# Patient Record
Sex: Female | Born: 1975 | Race: White | Hispanic: No | Marital: Single | Smoking: Never smoker
Health system: Southern US, Community
[De-identification: ages and names within clinical notes are randomized; demographics above are authoritative.]

## PROBLEM LIST (undated history)

## (undated) DIAGNOSIS — E669 Obesity, unspecified: Secondary | ICD-10-CM

## (undated) DIAGNOSIS — I10 Essential (primary) hypertension: Secondary | ICD-10-CM

## (undated) DIAGNOSIS — K529 Noninfective gastroenteritis and colitis, unspecified: Secondary | ICD-10-CM

## (undated) DIAGNOSIS — F79 Unspecified intellectual disabilities: Secondary | ICD-10-CM

## (undated) DIAGNOSIS — E119 Type 2 diabetes mellitus without complications: Secondary | ICD-10-CM

## (undated) HISTORY — PX: ABDOMINAL SURGERY: SHX537

---

## 2000-09-05 ENCOUNTER — Encounter: Admission: RE | Admit: 2000-09-05 | Discharge: 2000-09-05 | Payer: Self-pay | Admitting: *Deleted

## 2000-11-02 ENCOUNTER — Emergency Department (HOSPITAL_COMMUNITY): Admission: EM | Admit: 2000-11-02 | Discharge: 2000-11-02 | Payer: Self-pay | Admitting: Emergency Medicine

## 2000-11-02 ENCOUNTER — Encounter: Payer: Self-pay | Admitting: Emergency Medicine

## 2005-01-14 ENCOUNTER — Inpatient Hospital Stay (HOSPITAL_COMMUNITY): Admission: EM | Admit: 2005-01-14 | Discharge: 2005-01-19 | Payer: Self-pay | Admitting: Emergency Medicine

## 2005-01-16 ENCOUNTER — Ambulatory Visit: Payer: Self-pay | Admitting: Cardiovascular Disease

## 2005-07-07 ENCOUNTER — Encounter: Admission: RE | Admit: 2005-07-07 | Discharge: 2005-07-07 | Payer: Self-pay | Admitting: Family Medicine

## 2008-01-18 ENCOUNTER — Emergency Department (HOSPITAL_COMMUNITY): Admission: EM | Admit: 2008-01-18 | Discharge: 2008-01-18 | Payer: Self-pay | Admitting: Emergency Medicine

## 2010-09-04 NOTE — H&P (Signed)
NAMECHELCEY, CAPUTO          ACCOUNT NO.:  [id number]   MEDICAL RECORD NO.:  [id number]          PATIENT TYPE:  INP   LOCATION:  6529                         FACILITY:  [facility]   PHYSICIAN:  Lonia Blood, M.D.      DATE OF BIRTH:  20-Jul-1975   DATE OF ADMISSION:  01/14/2005  DATE OF DISCHARGE:                                HISTORY & PHYSICAL   PRIMARY CARE PHYSICIAN:  Dr. [name] so she is unassigned.   PRESENTING COMPLAINT:  Chest pain, near syncope.   HISTORY OF PRESENT ILLNESS:  Patient is a 35 year old with history of mental  retardation who lives with a caregiver at her house.  She has baseline  history of hypertension only.  Came in today secondary to sudden onset of  retrosternal chest pain described as heaviness associated with some  diaphoresis and near syncopal episode.  Patient denied any vomiting, but was  mildly nauseated.  This is the first episode.  Patient has risk factors for  cardiac disease including hypertension, dyslipidemia, obesity, and she has  secondhand smoke.  Her caregiver's husband smokes a lot.  No family history  but patient and caregiver is not aware of where her father is and what his  baseline medical history may be.   PAST MEDICAL HISTORY:  1.  Mental retardation.  2.  Hypertension.  3.  Joint pain as well and probably arthritis.   MEDICATIONS:  1.  Hydrochlorothiazide 25 mg daily.  2.  Nutritional supplements.   ALLERGIES:  Patient has no known drug allergies but she is allergic to bee  stings.   SOCIAL HISTORY:  As indicated, patient is under complete care of her current  caregiver.  She is mentally retarded and adopted.  No tobacco or alcohol  use.   FAMILY HISTORY:  Patient and caregiver do not know where her father is or  his background.  Her mom is deceased but she had baseline history of  diabetes.   REVIEW OF SYSTEMS:  10-point review of systems essentially as in HPI.   PHYSICAL EXAMINATION:  VITAL SIGNS:   Temperature 97.7, blood pressure  113/91, pulse 78, respiratory rate 22, saturations 96% on room air.  GENERAL:  She is alert and oriented, in no acute distress.  HEENT:  PERRL.  Patient is pleasantly retarded, mainly talking to her  caregiver.  HEENT shows poor dentition otherwise.  She had moon facies.  NECK:  Supple.  No JVD.  No lymphadenopathy.  RESPIRATORY:  Good air entry bilaterally.  No wheezes or rales.  CARDIOVASCULAR:  She has regular rate and rhythm.  ABDOMEN:  Soft, obese, nontender with positive bowel sounds.  EXTREMITIES:  No edema, cyanosis, or clubbing.   LABORATORIES:  White count 11.2, hemoglobin 15.3, platelet count 273 with  normal differentials.  Sodium 138, potassium 3.2, chloride 103, BUN 12,  glucose 89, bicarbonate of 30, creatinine 0.8.  Initial cardiac enzymes in  the ED are negative.  LFTs are also negative.  Her lipase is 71, amylase is  83.  D-dimer 1.32.  EKG showed sinus tachycardia on arrival with a rate of  105, normal  intervals.  Patient had flipped Ts in the lateral leads which is  a change from her previous EKG from July 2002.  Chest x-ray showed no acute  disease.  Chest CT without contrast was negative for PE.   ASSESSMENT:  This is a 35 year old presenting with some chest pain,  diaphoresis, and what appeared to be near syncope.  Patient is currently  doing okay with no chest pain on nitroglycerin except for headache.  Patient  also has mildly elevated lipase, but normal amylase.  Based on her risk  factor profile and the mild EKG change, although subtle, patient is likely  going to require cardiac rule out and work-up.  That will be followed up by  some risk stratification as well, maybe Cardiolite if her enzymes are  negative.  Will therefore do the following.   PLAN:  1.  Admit her to tele bed for 23-hour observation.  2.  Treat her hypertension with her home medication hydrochlorothiazide.  I      will repeat the lipase in the morning to  see if this is a fluke.  She is      not tender in the abdomen indicating that this is not likely to be      pancreatitis.  Correct her potassium.  Keep her on some nitroglycerin,      aspirin.  Consider beta blockers if her blood pressure will allow.  I      will put her on some Lovenox, but mainly prophylactic dose at this      stage.      Lonia Blood, M.D.  Electronically Signed     LG/MEDQ  D:  01/14/2005  T:  01/15/2005  Job:  [id number]

## 2010-09-04 NOTE — Discharge Summary (Signed)
Susan Garrison, Susan Garrison          ACCOUNT NO.:  [id number]   MEDICAL RECORD NO.:  [id number]          PATIENT TYPE:  INP   LOCATION:  3733                         FACILITY:  [facility]   PHYSICIAN:  Mobolaji B. Bakare, M.D.DATE OF BIRTH:  Nov 24, 1975   DATE OF ADMISSION:  01/14/2005  DATE OF DISCHARGE:  01/19/2005                                 DISCHARGE SUMMARY   FINAL DIAGNOSES:  1.  Noncardiac chest pain.  2.  Vertigo.  3.  Hypertension, controlled.  4.  Hyperlipidemia.  5.  Hyperglycemia.  6.  Mental retardation.  7.  Hypokalemia.   OPERATION/PROCEDURE:  1.  Cardiolite stress test with no stress induced ischemia.  Fixed defect      involved in the vascular inferior wall at the anterolateral apex septal      hypokinesis nonspecific. Ejection fraction of 51%.  2.  MRI of the lumbar spine showed no disk protrusion.  Spinal foraminal      stenosis in the lumbar spine.   Please refer to the admission history and physical.   HOSPITAL COURSE:  1.  Chest pain.  [name] is a 35 year old Caucasian female. She is      mentally retarded.  She presented with chest pain and diaphoresis and      question of syncope.  She was admitted to rule out cardiac ischemia.      She underwent a Cardiolite stress test which was significant for any      ischemia.  Because of oxygenization, there was no chest pain.  Telemetry      did not reveal any abnormal arrhythmia.  She had a positive D-dimer and      was subsequently evaluated with a CT scan angiogram which did not show      any pulmonary embolus.  She is felt to have metabolic syndrome with      obesity, elevated triglyceride, and low HDL.  She was consulted on      lifestyle changes.  2.  Orthostatic hypotension.  This was felt to be responsible for the      presyncope/syncope.  In addition, she also described symptoms      significant of vertigo.  She was empirically treated with meclizine with      resolution of symptoms.  3.   Hypertension.  This was controlled with adjustment of medication and      hospitalization.  Norvasc was increased.  4.  Hyperglycemia.  The patient was noted to have mildly elevated glucose.      She was transferred on dietary changes and lifestyle changes.  This was      done in particular so primary care giver. She was also started on niacin      in view of the dyslipidemia.   PERTINENT LABORATORY DATA:  Total cholesterol 194, HDL 37, LDL 96,  triglycerides 306,  Hemoglobin A1C 6.5. TSH 2.567.  D-dimer 1.32.  CT  angiogram of the chest was normal.  No evidence of pulmonary embolism or  acute findings.  CT scan of the head was negative for any acute intracranial  findings.  Chest x-ray:  Low  volume examination, acute chest disease.   CONSULTATIONS:  Cardiology consultation with [facility].   DISCHARGE MEDICATIONS:  1.  Hydrochlorothiazide 25 mg one daily.  2.  Norvasc 5 mg one p.o. daily.  3.  Niacin 500 mg once a day.   FOLLOW UP:  With Dr. Vickey Sages in one to two weeks.  She should have a followup  as to the lipid profile and first and third sugar in three months.      Mobolaji B. Corky Downs, M.D.  Electronically Signed     MBB/MEDQ  D:  02/07/2005  T:  02/08/2005  Job:  [id number]

## 2011-01-18 LAB — URINALYSIS, ROUTINE W REFLEX MICROSCOPIC
Bilirubin Urine: NEGATIVE
Glucose, UA: NEGATIVE
Ketones, ur: NEGATIVE
Leukocytes, UA: NEGATIVE
Nitrite: NEGATIVE
Protein, ur: 100 — AB
Specific Gravity, Urine: 1.024
Urobilinogen, UA: 0.2
pH: 5.5

## 2011-01-18 LAB — POCT I-STAT, CHEM 8
Chloride: 106
Glucose, Bld: 110 — ABNORMAL HIGH
Hemoglobin: 14.6
Sodium: 143
TCO2: 27

## 2011-01-18 LAB — DIFFERENTIAL
Basophils Absolute: 0.3 — ABNORMAL HIGH
Basophils Relative: 2 — ABNORMAL HIGH
Eosinophils Absolute: 0.2
Eosinophils Relative: 2
Lymphocytes Relative: 19
Lymphs Abs: 2.4
Monocytes Absolute: 0.9
Monocytes Relative: 7

## 2011-01-18 LAB — COMPREHENSIVE METABOLIC PANEL
ALT: 13
AST: 18
Albumin: 3.5
BUN: 10
Calcium: 9.2
Chloride: 105
GFR calc non Af Amer: >60

## 2011-01-18 LAB — URINE MICROSCOPIC-ADD ON

## 2011-01-18 LAB — POCT PREGNANCY, URINE: Preg Test, Ur: NEGATIVE

## 2011-01-18 LAB — CBC
MCV: 87.9
Platelets: 373

## 2011-06-27 ENCOUNTER — Other Ambulatory Visit: Payer: Self-pay

## 2011-06-27 ENCOUNTER — Emergency Department (HOSPITAL_COMMUNITY): Payer: Medicare Other

## 2011-06-27 ENCOUNTER — Emergency Department (HOSPITAL_COMMUNITY)
Admission: EM | Admit: 2011-06-27 | Discharge: 2011-06-27 | Disposition: A | Discharge status: Home Health | Payer: Medicare Other | Attending: Emergency Medicine | Admitting: Emergency Medicine

## 2011-06-27 ENCOUNTER — Encounter (HOSPITAL_COMMUNITY): Payer: Self-pay | Admitting: *Deleted

## 2011-06-27 DIAGNOSIS — R079 Chest pain, unspecified: Secondary | ICD-10-CM | POA: Insufficient documentation

## 2011-06-27 DIAGNOSIS — R42 Dizziness and giddiness: Secondary | ICD-10-CM | POA: Insufficient documentation

## 2011-06-27 DIAGNOSIS — K219 Gastro-esophageal reflux disease without esophagitis: Secondary | ICD-10-CM | POA: Insufficient documentation

## 2011-06-27 DIAGNOSIS — I10 Essential (primary) hypertension: Secondary | ICD-10-CM | POA: Insufficient documentation

## 2011-06-27 DIAGNOSIS — F70 Mild intellectual disabilities: Secondary | ICD-10-CM | POA: Insufficient documentation

## 2011-06-27 HISTORY — DX: Unspecified intellectual disabilities: F79

## 2011-06-27 HISTORY — DX: Essential (primary) hypertension: I10

## 2011-06-27 HISTORY — DX: Obesity, unspecified: E66.9

## 2011-06-27 LAB — CBC
HCT: 40.6 % (ref 36.0–46.0)
Hemoglobin: 13.8 g/dL (ref 12.0–15.0)
MCHC: 34 g/dL (ref 30.0–36.0)
RBC: 4.67 MIL/uL (ref 3.87–5.11)
RDW: 13.6 % (ref 11.5–15.5)

## 2011-06-27 LAB — DIFFERENTIAL
Basophils Relative: 0 % (ref 0–1)
Lymphocytes Relative: 19 % (ref 12–46)
Lymphs Abs: 1.7 10*3/uL (ref 0.7–4.0)
Monocytes Absolute: 0.6 10*3/uL (ref 0.1–1.0)
Monocytes Relative: 7 % (ref 3–12)
Neutrophils Relative %: 72 % (ref 43–77)

## 2011-06-27 LAB — COMPREHENSIVE METABOLIC PANEL
Calcium: 8.8 mg/dL (ref 8.4–10.5)
GFR calc Af Amer: >90 mL/min (ref >90)
Sodium: 139 mEq/L (ref 135–145)

## 2011-06-27 MED ORDER — GI COCKTAIL ~~LOC~~
30.0000 mL | Freq: Once | ORAL | Status: AC
  Administered 2011-06-27: 30 mL via ORAL
  Filled 2011-06-27: qty 30

## 2011-06-27 MED ORDER — PANTOPRAZOLE SODIUM 20 MG PO TBEC: 40.0000 mg | DELAYED_RELEASE_TABLET | Freq: Every day | ORAL | Status: AC

## 2011-06-27 NOTE — Discharge Instructions (Signed)
Your testing today was normal. This may represent acid reflux. Please take the medication as prescribed to see if this helps with the pain. Return to the ED for worsening pain, shortness of breath, or other worrisome symptoms.  RESOURCE GUIDE  Dental Problems  Patients with Medicaid: [facility] [phone]                                           [phone] [address] Phone:  [phone]                                                  Phone:  [phone]  If unable to pay or uninsured, contact:  [facility] or [facility]. to become qualified for the adult dental clinic.  Chronic Pain Problems Contact [facility]  [phone] Patients need to be referred by their primary care doctor.  Insufficient Money for Medicine Contact United Way:  call "211" or [facility] 409-069-3322.  No Primary Care Doctor Call [facility]  [phone] Other agencies that provide inexpensive medical care    [facility]  [phone]    [facility]  [phone]    [facility]  [phone]    [facility]  [phone]    [facility]  [phone]    [facility]  [phone]  Psychological Services [facility]  [phone] [facility]  [phone] [facility]   [phone] (emergency services 7062005707)  Substance Abuse Resources [facility]  (541)077-6513 [facility] ([phone] [facility] [place] 701-643-6073 Floydene Flock (941)510-4848 Residential & Outpatient Substance Abuse Program  [phone]  Abuse/Neglect [facility] Child Abuse Hotline (559)449-9264 [facility] Child Abuse Hotline 831-620-9710 (After Hours)  Emergency Shelter [facility] (571)760-7200  Maternity Homes [facility] [phone] [facility] [phone]  MRSA  Hotline #:   (805)437-0810    [facility] Resources  [facility]     [employer]                          [facility] Dept. [address] [place]                       [address]      [address]  [place]                                                Cristobal Goldmann Phone:  [phone]  Phone:  [phone]                 Phone:  [phone]  [facility] Phone:  [phone]  [facility] Child Abuse Hotline [phone] (After Hours)  Gastroesophageal Reflux Disease, Adult Gastroesophageal reflux disease (GERD) happens when acid from your stomach flows up into the esophagus. When acid comes in contact with the esophagus, the acid causes soreness (inflammation) in the esophagus. Over time, GERD may create small holes (ulcers) in the lining of the esophagus. CAUSES   Increased body weight. This puts pressure on the stomach, making acid rise from the stomach into the esophagus.   Smoking. This increases acid production in the stomach.   Drinking alcohol. This causes decreased pressure in the lower esophageal sphincter (valve or ring of muscle between the esophagus and stomach), allowing acid from the stomach into the esophagus.   Late evening meals and a full stomach. This increases pressure and acid production in the stomach.   A malformed lower esophageal sphincter.  Sometimes, no cause is found. SYMPTOMS   Burning pain in the lower part of the mid-chest behind the breastbone and in the mid-stomach area. This may occur twice a week or more often.   Trouble swallowing.   Sore throat.   Dry cough.   Asthma-like symptoms including chest tightness, shortness of breath, or wheezing.  DIAGNOSIS  Your caregiver may be able to diagnose GERD based on your symptoms. In some cases, X-rays and other tests may be done to check for  complications or to check the condition of your stomach and esophagus. TREATMENT  Your caregiver may recommend over-the-counter or prescription medicines to help decrease acid production. Ask your caregiver before starting or adding any new medicines.  HOME CARE INSTRUCTIONS   Change the factors that you can control. Ask your caregiver for guidance concerning weight loss, quitting smoking, and alcohol consumption.   Avoid foods and drinks that make your symptoms worse, such as:   Caffeine or alcoholic drinks.   Chocolate.   Peppermint or mint flavorings.   Garlic and onions.   Spicy foods.   Citrus fruits, such as oranges, lemons, or limes.   Tomato-based foods such as sauce, chili, salsa, and pizza.   Fried and fatty foods.   Avoid lying down for the 3 hours prior to your bedtime or prior to taking a nap.   Eat small, frequent meals instead of large meals.   Wear loose-fitting clothing. Do not wear anything tight around your waist that causes pressure on your stomach.   Raise the head of your bed 6 to 8 inches with wood blocks to help you sleep. Extra pillows will not help.   Only take over-the-counter or prescription medicines for pain, discomfort, or fever as directed by your caregiver.   Do not take aspirin, ibuprofen, or other nonsteroidal anti-inflammatory drugs (NSAIDs).  SEEK IMMEDIATE MEDICAL CARE IF:   You have pain in your arms, neck, jaw, teeth, or back.   Your pain increases or changes in intensity or duration.   You develop nausea, vomiting, or sweating (diaphoresis).   You develop shortness of breath, or you faint.   Your vomit is green, yellow, black, or looks like coffee grounds or blood.   Your stool is red, bloody, or black.  These symptoms could be signs of other problems, such as heart disease, gastric bleeding, or esophageal bleeding. MAKE SURE YOU:   Understand these instructions.   Will watch your  condition.   Will get help right away if  you are not doing well or get worse.  Document Released: 01/13/2005 Document Revised: 03/25/2011 Document Reviewed: 10/23/2010 [facility] Patient Information 2012 [place], [place].

## 2011-06-27 NOTE — ED Notes (Signed)
Pt sts the dizziness goes away when she sits down. The dizziness started first.

## 2011-06-27 NOTE — ED Provider Notes (Signed)
History     CSN: [id number]  Arrival date & time 06/27/11  8295   First MD Initiated Contact with Patient 06/27/11 [phone]      Chief Complaint  Patient presents with  . Chest Pain    (Consider location/radiation/quality/duration/timing/severity/associated sxs/prior treatment) Patient is a 36 y.o. female presenting with chest pain. The history is provided by the patient. The history is limited by a developmental delay.  Chest Pain The chest pain began 12 - 24 hours ago. Chest pain occurs constantly. The chest pain is improving. The quality of the pain is described as aching. The pain does not radiate. Primary symptoms include dizziness. Pertinent negatives for primary symptoms include no fever, no shortness of breath, no cough, no wheezing, no nausea and no vomiting.  Dizziness does not occur with nausea, vomiting or diaphoresis.   Pertinent negatives for associated symptoms include no diaphoresis.   Pt presents with chest pain which started last evening. She states she was trying to get her dog off her bed so she could go to sleep when she suddenly noted pain in the center of her chest. Described as "it feels like I'm getting punched in the chest." Denies radiation of the pain. No associated nausea or vomiting. Did have a brief, transient episode of dizziness, described as unsteadiness. Denies diaphoresis or dyspnea. She did not have any nausea or emesis. Pain has continued since that time.   Pt is a somewhat poor historian as she has mild MR. Mom states that she has had acid reflux before but "doesn't know what it feels like."  Past Medical History  Diagnosis Date  . Mental deficiency   . Hypertension   . Obese     No past surgical history on file.  No family history on file.  History  Substance Use Topics  . Smoking status: Passive Smoker  . Smokeless tobacco: Not on file  . Alcohol Use: No    OB History    Grav Para Term Preterm Abortions TAB SAB Ect Mult Living            Review of Systems  Constitutional: Negative for fever and diaphoresis.  Respiratory: Negative for cough, shortness of breath and wheezing.   Cardiovascular: Positive for chest pain.  Gastrointestinal: Negative for nausea and vomiting.  Musculoskeletal: Negative for myalgias.  Neurological: Positive for dizziness.    Allergies  Review of patient's allergies indicates no known allergies.  Home Medications   Current Outpatient Rx  Name Route Sig Dispense Refill  . TYLENOL ARTHRITIS PAIN PO Oral Take 1 tablet by mouth daily as needed. For pain.      BP 144/96  Pulse 82  Temp(Src) 98.1 F (36.7 C) (Oral)  Resp 18  SpO2 98%  Physical Exam  Nursing note and vitals reviewed. Constitutional: She appears well-developed and well-nourished. No distress.  HENT:  Head: Normocephalic and atraumatic.  Mouth/Throat: Oropharynx is clear and moist.  Eyes: EOM are normal. Pupils are equal, round, and reactive to light.  Cardiovascular: Normal rate, regular rhythm and normal heart sounds.  Exam reveals no gallop and no friction rub.   No murmur heard. Pulmonary/Chest: Effort normal and breath sounds normal. She has no wheezes. She exhibits tenderness.       Reproducibly tender to palpation to center of chest  Abdominal: Soft. There is no tenderness. There is no rebound and no guarding.  Musculoskeletal: Normal range of motion.  Neurological: She is alert.  Skin: Skin is warm and dry. No rash  noted. She is not diaphoretic.  Psychiatric: She has a normal mood and affect.    ED Course  Procedures (including critical care time)   Date: 06/27/2011  Rate: 75  Rhythm: normal sinus rhythm  QRS Axis: normal  Intervals: normal  ST/T Wave abnormalities: nonspecific T wave changes  Conduction Disutrbances:none  Narrative Interpretation:   Old EKG Reviewed: compared with 12/2004, no significant changes  Labs Reviewed  COMPREHENSIVE METABOLIC PANEL - Abnormal; Notable for the  following:    Glucose, Bld 105 (*)    Albumin 2.9 (*)    Total Bilirubin 0.2 (*)    GFR calc non Af Amer 90 (*)    All other components within normal limits  CBC  DIFFERENTIAL  POCT I-STAT TROPONIN I   Dg Chest 2 View  06/27/2011  *RADIOLOGY REPORT*  Clinical Data: 36 year old female with chest pain.  CHEST - 2 VIEW  Comparison: 01/14/2005  Findings: The cardiomediastinal silhouette is unremarkable. The lungs are clear. There is no evidence of focal airspace disease, pulmonary edema, suspicious pulmonary nodule/mass, pleural effusion, or pneumothorax. No acute bony abnormalities are identified.  IMPRESSION: No evidence of active cardiopulmonary disease.  Original Report Authenticated By: Rosendo Gros, M.D.     1. Acid reflux       MDM  35yo F with CP which started yesterday. No changes on ECG, VSS. Nontoxic appearing, NAD. CXR, labs including troponin unremarkable. I personally reviewed the CXR which was unremarkable. GI cocktail relieved pt's pain. Lower suspicion for acute cardiopulm abnormality. Findings d/w pt and mom. Will give trial of Protonix. Return precautions discussed.        Grant Fontana, [place] 06/27/11 1624

## 2011-06-27 NOTE — ED Notes (Signed)
Started during night. Not under stress. Hx. Mental disability. No n/v.

## 2011-06-27 NOTE — ED Notes (Signed)
Pt placed in gown, on cardiac monitor, BP's and pulse ox.

## 2011-07-02 NOTE — ED Provider Notes (Signed)
Medical screening examination/treatment/procedure(s) were performed by non-physician practitioner and as supervising physician I was immediately available for consultation/collaboration.   Hurman Horn, MD 07/02/11 [phone]

## 2011-09-08 ENCOUNTER — Other Ambulatory Visit: Payer: Self-pay | Admitting: Nephrology

## 2011-09-08 DIAGNOSIS — R809 Proteinuria, unspecified: Secondary | ICD-10-CM

## 2011-09-08 DIAGNOSIS — N181 Chronic kidney disease, stage 1: Secondary | ICD-10-CM

## 2011-09-10 ENCOUNTER — Ambulatory Visit
Admission: RE | Admit: 2011-09-10 | Discharge: 2011-09-10 | Disposition: A | Discharge status: Home / Self Care | Payer: Medicare Other | Source: Ambulatory Visit | Attending: Nephrology | Admitting: Nephrology

## 2011-09-10 DIAGNOSIS — N181 Chronic kidney disease, stage 1: Secondary | ICD-10-CM

## 2011-09-10 DIAGNOSIS — R809 Proteinuria, unspecified: Secondary | ICD-10-CM

## 2015-06-24 ENCOUNTER — Other Ambulatory Visit: Payer: Self-pay | Admitting: Family Medicine

## 2015-06-24 DIAGNOSIS — Z1231 Encounter for screening mammogram for malignant neoplasm of breast: Secondary | ICD-10-CM

## 2015-07-03 ENCOUNTER — Ambulatory Visit
Admission: RE | Admit: 2015-07-03 | Discharge: 2015-07-03 | Disposition: A | Discharge status: Home / Self Care | Payer: Medicare Other | Source: Ambulatory Visit | Attending: Family Medicine | Admitting: Family Medicine

## 2015-07-03 DIAGNOSIS — Z1231 Encounter for screening mammogram for malignant neoplasm of breast: Secondary | ICD-10-CM

## 2015-09-29 ENCOUNTER — Other Ambulatory Visit: Payer: Self-pay | Admitting: Family Medicine

## 2015-09-29 ENCOUNTER — Ambulatory Visit
Admission: RE | Admit: 2015-09-29 | Discharge: 2015-09-29 | Disposition: A | Discharge status: Home / Self Care | Payer: Medicare Other | Source: Ambulatory Visit | Attending: Family Medicine | Admitting: Family Medicine

## 2015-09-29 DIAGNOSIS — R52 Pain, unspecified: Secondary | ICD-10-CM

## 2016-08-17 ENCOUNTER — Other Ambulatory Visit: Payer: Self-pay | Admitting: Family Medicine

## 2016-08-17 DIAGNOSIS — Z1231 Encounter for screening mammogram for malignant neoplasm of breast: Secondary | ICD-10-CM

## 2016-09-03 ENCOUNTER — Other Ambulatory Visit: Payer: Self-pay | Admitting: Family Medicine

## 2016-09-03 ENCOUNTER — Ambulatory Visit
Admission: RE | Admit: 2016-09-03 | Discharge: 2016-09-03 | Disposition: A | Discharge status: Home / Self Care | Payer: Medicare Other | Source: Ambulatory Visit | Attending: Family Medicine | Admitting: Family Medicine

## 2016-09-03 DIAGNOSIS — Z1231 Encounter for screening mammogram for malignant neoplasm of breast: Secondary | ICD-10-CM

## 2017-02-28 ENCOUNTER — Other Ambulatory Visit: Payer: Self-pay | Admitting: Nurse Practitioner

## 2017-02-28 DIAGNOSIS — R1031 Right lower quadrant pain: Secondary | ICD-10-CM

## 2017-02-28 DIAGNOSIS — R1010 Upper abdominal pain, unspecified: Secondary | ICD-10-CM

## 2017-03-03 ENCOUNTER — Other Ambulatory Visit: Payer: Medicare Other

## 2017-03-08 ENCOUNTER — Ambulatory Visit
Admission: RE | Admit: 2017-03-08 | Discharge: 2017-03-08 | Disposition: A | Discharge status: Home / Self Care | Payer: Medicare Other | Source: Ambulatory Visit | Attending: Nurse Practitioner | Admitting: Nurse Practitioner

## 2017-03-08 ENCOUNTER — Other Ambulatory Visit: Payer: Self-pay | Admitting: Nurse Practitioner

## 2017-03-08 DIAGNOSIS — R1031 Right lower quadrant pain: Secondary | ICD-10-CM

## 2017-03-08 DIAGNOSIS — R1010 Upper abdominal pain, unspecified: Secondary | ICD-10-CM

## 2017-05-10 ENCOUNTER — Encounter: Payer: Self-pay | Admitting: Nurse Practitioner

## 2017-05-16 ENCOUNTER — Encounter: Payer: Self-pay | Admitting: Gastroenterology

## 2017-05-31 ENCOUNTER — Other Ambulatory Visit (INDEPENDENT_AMBULATORY_CARE_PROVIDER_SITE_OTHER): Payer: Medicare Other

## 2017-05-31 ENCOUNTER — Encounter: Payer: Self-pay | Admitting: Gastroenterology

## 2017-05-31 ENCOUNTER — Ambulatory Visit (INDEPENDENT_AMBULATORY_CARE_PROVIDER_SITE_OTHER): Payer: Medicare Other | Admitting: Gastroenterology

## 2017-05-31 VITALS — BP 128/76 | HR 80 | Ht 65.0 in | Wt 217.0 lb

## 2017-05-31 DIAGNOSIS — R109 Unspecified abdominal pain: Secondary | ICD-10-CM | POA: Insufficient documentation

## 2017-05-31 DIAGNOSIS — R1011 Right upper quadrant pain: Secondary | ICD-10-CM | POA: Insufficient documentation

## 2017-05-31 HISTORY — DX: Right upper quadrant pain: R10.11

## 2017-05-31 LAB — COMPREHENSIVE METABOLIC PANEL
ALBUMIN: 3.5 g/dL (ref 3.5–5.2)
ALK PHOS: 54 U/L (ref 39–117)
ALT: 11 U/L (ref 0–35)
AST: 15 U/L (ref 0–37)
BUN: 23 mg/dL (ref 6–23)
CALCIUM: 8.7 mg/dL (ref 8.4–10.5)
CHLORIDE: 105 meq/L (ref 96–112)
CO2: 30 mEq/L (ref 19–32)
Creatinine, Ser: 1.06 mg/dL (ref 0.40–1.20)
GFR: 60.21 mL/min (ref >60.00)
Glucose, Bld: 91 mg/dL (ref 70–99)
POTASSIUM: 4.2 meq/L (ref 3.5–5.1)
Sodium: 140 mEq/L (ref 135–145)
TOTAL PROTEIN: 7.2 g/dL (ref 6.0–8.3)
Total Bilirubin: 0.5 mg/dL (ref 0.2–1.2)

## 2017-05-31 LAB — CBC WITH DIFFERENTIAL/PLATELET
Basophils Absolute: 0 10*3/uL (ref 0.0–0.1)
Basophils Relative: 0.4 % (ref 0.0–3.0)
EOS PCT: 3.8 % (ref 0.0–5.0)
Eosinophils Absolute: 0.4 10*3/uL (ref 0.0–0.7)
HEMATOCRIT: 41.3 % (ref 36.0–46.0)
HEMOGLOBIN: 13.9 g/dL (ref 12.0–15.0)
Lymphocytes Relative: 24.2 % (ref 12.0–46.0)
Lymphs Abs: 2.4 10*3/uL (ref 0.7–4.0)
MCHC: 33.7 g/dL (ref 30.0–36.0)
MCV: 86.6 fl (ref 78.0–100.0)
MONO ABS: 0.7 10*3/uL (ref 0.1–1.0)
MONOS PCT: 6.7 % (ref 3.0–12.0)
Neutro Abs: 6.3 10*3/uL (ref 1.4–7.7)
Neutrophils Relative %: 64.9 % (ref 43.0–77.0)
Platelets: 330 10*3/uL (ref 150.0–400.0)
RBC: 4.78 Mil/uL (ref 3.87–5.11)
RDW: 14.7 % (ref 11.5–15.5)
WBC: 9.7 10*3/uL (ref 4.0–10.5)

## 2017-05-31 NOTE — Progress Notes (Signed)
05/31/2017 Susan Garrison [id number] 1975/07/30   HISTORY OF PRESENT ILLNESS:  This is a 42 year old female with some mental "deficiency" who was referred here by her PCP, Dr. Jeannetta Nap, for complaints of RUQ/right flank pain.  Unsure what is causing her pain.  It is difficult to decipher details but there are absolutely no other GI or urinary complaints.  Denies nausea, vomiting, diarrhea, fevers, chills, constipation.  Moves her bowels well.  No dark or bloody stools.  This has been present for 2-3 months on a daily basis.  Pain gets worse with movement such as riding her bike and is usually worse before her period.  Has been using some topical medication for it, which seems to help to some degree.  Ultrasound showed ? Gallstones and also a uterine fibroid.    Past Medical History:  Diagnosis Date  . Hypertension   . Mental deficiency   . Obese    Past Surgical History:  Procedure Laterality Date  . ABDOMINAL SURGERY Right    unknown to date of surgery    reports that she is a non-smoker but has been exposed to tobacco smoke. she has never used smokeless tobacco. She reports that she does not drink alcohol or use drugs. family history includes Diabetes in her mother. No Known Allergies    Outpatient Encounter Medications as of 05/31/2017  Medication Sig  . Acetaminophen (TYLENOL ARTHRITIS PAIN PO) Take 1 tablet by mouth daily as needed. For pain.  Marland Kitchen atenolol (TENORMIN) 100 MG tablet Take 100 mg by mouth daily.  . Cholecalciferol (VITAMIN D3) 2000 units TABS Take 2,000 Units by mouth daily.  Marland Kitchen lovastatin (MEVACOR) 20 MG tablet Take 20 mg by mouth at bedtime.  . metFORMIN (GLUCOPHAGE) 500 MG tablet Take 500 mg by mouth daily with breakfast.  . [DISCONTINUED] gemfibrozil (LOPID) 600 MG tablet Take 600 mg by mouth daily.  . [DISCONTINUED] NIACIN PO Take 1 tablet by mouth at bedtime.   No facility-administered encounter medications on file as of 05/31/2017.      REVIEW OF  SYSTEMS  : All other systems reviewed and negative except where noted in the History of Present Illness.   PHYSICAL EXAM: BP 128/76   Pulse 80   Ht 5\' 5"  (1.651 m)   Wt 217 lb (98.4 kg)   BMI 36.11 kg/m  General: Well developed white female in no acute distress Head: Normocephalic and atraumatic Eyes:  Sclerae anicteric, conjunctiva pink. Ears: Normal auditory acuity Lungs: Clear throughout to auscultation; no increased WOB. Heart: Regular rate and rhythm; no M/R/G. Abdomen: Soft, non-distended.  BS present.  RUQ/right flank TTP. Musculoskeletal: Symmetrical with no gross deformities  Skin: No lesions on visible extremities Extremities: No edema  Neurological: Alert oriented x 4, grossly non-focal Psychological:  Alert and cooperative. Normal mood and affect  ASSESSMENT AND PLAN: *42 year old female with some mental "deficiency" with complaints of RUQ/right flank pain x 2-3 months.  Unsure what is causing her pain.  It is difficult to decipher details but there are absolutely no other GI or urinary complaints.  Pain gets worse with movement such as riding her bike and is usually worse before her period.  Ultrasound showed ? Gallstones and also a uterine fibroid.  I am not sure if this is GI related.  Will order CBC and CMP.  Will check CT scan of the abdomen and pelvis with contrast.  Continue topical treatment for now.   CC:  Susan Garrison  Joelene Millin, *

## 2017-05-31 NOTE — Progress Notes (Signed)
I agree with the above note, plan 

## 2017-05-31 NOTE — Patient Instructions (Signed)
Your physician has requested that you go to the basement for lab work before leaving today.  You have been scheduled for a CT scan of the abdomen and pelvis at [facility] ([address]---this is in the same building as [facility]).   You are scheduled on 06/08/17 at 3:30 pm. You should arrive 15 minutes prior to your appointment time for registration. Please follow the written instructions below on the day of your exam:  WARNING: IF YOU ARE ALLERGIC TO IODINE/X-RAY DYE, PLEASE NOTIFY RADIOLOGY IMMEDIATELY AT [phone]! YOU WILL BE GIVEN A 13 HOUR PREMEDICATION PREP.  1) Do not eat after 11:30 am (4 hours prior to your test) 2) You have been given 2 bottles of oral contrast to drink. The solution may taste               better if refrigerated, but do NOT add ice or any other liquid to this solution. Shake             well before drinking.    Drink 1 bottle of contrast @ 1:30 pm (2 hours prior to your exam)  Drink 1 bottle of contrast @ 2:30 pm (1 hour prior to your exam)  You may take any medications as prescribed with a small amount of water except for the following: Metformin, Glucophage, Glucovance, Avandamet, Riomet, Fortamet, Actoplus Met, Janumet, Glumetza or Metaglip. The above medications must be held the day of the exam AND 48 hours after the exam.  The purpose of you drinking the oral contrast is to aid in the visualization of your intestinal tract. The contrast solution may cause some diarrhea. Before your exam is started, you will be given a small amount of fluid to drink. Depending on your individual set of symptoms, you may also receive an intravenous injection of x-ray contrast/dye. Plan on being at [facility] for 30 minutes or longer, depending on the type of exam you are having performed.  This test typically takes 30-45 minutes to complete.  If you have any questions regarding your exam or if you need to reschedule, you may call the CT  department at [phone] between the hours of 8:00 am and 5:00 pm, Monday-Friday.  ________________________________________________________________________

## 2017-06-08 ENCOUNTER — Ambulatory Visit (INDEPENDENT_AMBULATORY_CARE_PROVIDER_SITE_OTHER)
Admission: RE | Admit: 2017-06-08 | Discharge: 2017-06-08 | Disposition: A | Discharge status: Home / Self Care | Payer: Medicare Other | Source: Ambulatory Visit | Attending: Gastroenterology | Admitting: Gastroenterology

## 2017-06-08 DIAGNOSIS — R1011 Right upper quadrant pain: Secondary | ICD-10-CM

## 2017-06-08 MED ORDER — IOPAMIDOL (ISOVUE-300) INJECTION 61%
100.0000 mL | Freq: Once | INTRAVENOUS | Status: AC | PRN
  Administered 2017-06-08: 100 mL via INTRAVENOUS

## 2021-04-26 ENCOUNTER — Encounter: Payer: Self-pay | Admitting: Family Medicine

## 2021-04-27 ENCOUNTER — Telehealth: Payer: Self-pay

## 2021-04-27 NOTE — Telephone Encounter (Signed)
CALLED PATIENT NO ANSWER LEFT VOICEMAIL FOR A CALL BACK ? ?

## 2021-04-28 ENCOUNTER — Telehealth: Payer: Self-pay

## 2021-04-28 NOTE — Telephone Encounter (Signed)
CALLED PATIENT NO ANSWER LEFT VOICEMAIL FOR A CALL BACK ? ?

## 2021-04-28 NOTE — Telephone Encounter (Signed)
Called spoke with bridgets caretaker but could not schedule because she is not on the release of information

## 2021-04-28 NOTE — Telephone Encounter (Signed)
Letter sent.

## 2021-04-29 ENCOUNTER — Telehealth: Payer: Self-pay

## 2021-04-29 NOTE — Telephone Encounter (Signed)
Pt called back and left a message to return phone call. I returned the phone call and can not leave a voice message.

## 2021-04-29 NOTE — Telephone Encounter (Signed)
CALLED PATIENT NO ANSWER no way to leave a message

## 2021-05-07 ENCOUNTER — Telehealth: Payer: Self-pay

## 2021-05-07 NOTE — Telephone Encounter (Signed)
Patient and caregiver came in and wanted to see a provider first before colonoscopy so schedule with dr Marius Ditch 06/11/21

## 2021-05-28 ENCOUNTER — Telehealth: Payer: Self-pay

## 2021-05-28 NOTE — Telephone Encounter (Signed)
Patient is scheduled for 07/08/2021 if colonoscopy is scheduled we have to fax information to heather hicks at [phone] to get it approved and heather hicks phone number is [phone] if needed

## 2021-06-10 ENCOUNTER — Encounter: Payer: Self-pay | Admitting: Gastroenterology

## 2021-06-10 ENCOUNTER — Other Ambulatory Visit: Payer: Self-pay

## 2021-06-10 ENCOUNTER — Ambulatory Visit (INDEPENDENT_AMBULATORY_CARE_PROVIDER_SITE_OTHER): Payer: Medicare Other | Admitting: Gastroenterology

## 2021-06-10 VITALS — BP 107/71 | HR 77 | Temp 98.1°F | Ht 65.0 in | Wt 222.1 lb

## 2021-06-10 DIAGNOSIS — R195 Other fecal abnormalities: Secondary | ICD-10-CM | POA: Diagnosis not present

## 2021-06-10 MED ORDER — NA SULFATE-K SULFATE-MG SULF 17.5-3.13-1.6 GM/177ML PO SOLN: 354.0000 mL | Freq: Once | ORAL | 0 refills | Status: AC

## 2021-06-10 NOTE — Progress Notes (Signed)
Susan Darby, MD [address]  [place]  [place], [place] [address]  Main: [phone]  Fax: [phone]    Gastroenterology Consultation  Referring Provider:     Leonard Downing, * Primary Care Physician:  Leonard Downing, MD Primary Gastroenterologist:  Dr. Cephas Garrison Reason for Consultation: Fecal occult positive blood        HPI:   Susan Garrison is a 46 y.o. female referred by Dr. Arelia Sneddon, Curt Jews, MD  for consultation & management of fecal occult positive test.  Patient has history of mild cognitive impairment, accompanied by her grandmother who is her caretaker.  Patient denies any GI symptoms.  No known history of anemia.  She denies any constipation, diarrhea.  She denies any rectal bleeding.  She had a surgery in her abdomen after her birth.  NSAIDs: None  Antiplts/Anticoagulants/Anti thrombotics: None  GI Procedures: None No known family history of GI malignancy  Past Medical History:  Diagnosis Date   Hypertension    Mental deficiency    Obese     Past Surgical History:  Procedure Laterality Date   ABDOMINAL SURGERY Right    unknown to date of surgery   Current Outpatient Medications:    Acetaminophen (TYLENOL ARTHRITIS PAIN PO), Take 1 tablet by mouth daily as needed. For pain., Disp: , Rfl:    allopurinol (ZYLOPRIM) 300 MG tablet, , Disp: , Rfl:    atenolol (TENORMIN) 100 MG tablet, Take 100 mg by mouth daily., Disp: , Rfl:    Cholecalciferol (VITAMIN D3) 2000 units TABS, Take 2,000 Units by mouth daily., Disp: , Rfl:    FARXIGA 10 MG TABS tablet, Take 10 mg by mouth daily., Disp: , Rfl:    lovastatin (MEVACOR) 40 MG tablet, , Disp: , Rfl:    metFORMIN (GLUCOPHAGE) 500 MG tablet, Take 500 mg by mouth daily with breakfast., Disp: , Rfl:    olmesartan-hydrochlorothiazide (BENICAR HCT) 40-12.5 MG tablet, , Disp: , Rfl:    Na Sulfate-K Sulfate-Mg Sulf 17.5-3.13-1.6 GM/177ML SOLN, Take 354 mLs by mouth once for 1 dose.,  Disp: 708 mL, Rfl: 0' Family History  Problem Relation Age of Onset   Diabetes Mother      Social History   Tobacco Use   Smoking status: Passive Smoke Exposure - Never Smoker   Smokeless tobacco: Never  Vaping Use   Vaping Use: Never used  Substance Use Topics   Alcohol use: No   Drug use: No    Allergies as of 06/10/2021   (No Known Allergies)    Review of Systems:    All systems reviewed and negative except where noted in HPI.   Physical Exam:  BP 107/71 (BP Location: Left Arm, Patient Position: Sitting, Cuff Size: Normal)    Pulse 77    Temp 98.1 F (36.7 C) (Oral)    Ht 5\' 5"  (1.651 m)    Wt 222 lb 2 oz (100.8 kg)    BMI 36.96 kg/m  No LMP recorded.  General:   Alert,  Well-developed, well-nourished, pleasant and cooperative in NAD Head:  Normocephalic and atraumatic. Eyes:  Sclera clear, no icterus.   Conjunctiva pink. Ears:  Normal auditory acuity. Nose:  No deformity, discharge, or lesions. Mouth:  No deformity or lesions,oropharynx pink & moist. Neck:  Supple; no masses or thyromegaly. Lungs:  Respirations even and unlabored.  Clear throughout to auscultation.   No wheezes, crackles, or rhonchi. No acute distress. Heart:  Regular rate and  rhythm; no murmurs, clicks, rubs, or gallops. Abdomen:  Normal bowel sounds. Soft, non-tender and non-distended without masses, hepatosplenomegaly or hernias noted.  No guarding or rebound tenderness.   Rectal: Not performed Msk:  Symmetrical without gross deformities. Good, equal movement & strength bilaterally. Pulses:  Normal pulses noted. Extremities:  No clubbing or edema.  No cyanosis. Neurologic:  Alert and oriented x3;  grossly normal neurologically. Skin:  Intact without significant lesions or rashes. No jaundice. Psych:  Alert and cooperative. Normal mood and affect.  Imaging Studies: Reviewed  Assessment and Plan:   [name] is a 46 y.o. female with cognitive impairment, metabolic syndrome is seen  in consultation for fecal occult positive test.  Patient does not have any GI symptoms  Proceed with diagnostic colonoscopy with 2-day prep  I have discussed alternative options, risks & benefits,  which include, but are not limited to, bleeding, infection, perforation,respiratory complication & drug reaction.  The patient agrees with this plan & written consent will be obtained.      Follow up as needed   Susan Darby, MD

## 2021-06-23 ENCOUNTER — Telehealth: Payer: Self-pay | Admitting: Gastroenterology

## 2021-06-23 ENCOUNTER — Telehealth: Payer: Self-pay

## 2021-06-23 NOTE — Telephone Encounter (Signed)
Pt case worker Wayna Chalet would like to speak to you in ref to pt sched Colonoscopy ?

## 2021-06-23 NOTE — Telephone Encounter (Signed)
error 

## 2021-06-29 ENCOUNTER — Encounter: Payer: Self-pay | Admitting: Gastroenterology

## 2021-06-29 ENCOUNTER — Encounter
Admission: RE | Disposition: A | Discharge status: Home / Self Care | Payer: Self-pay | Source: Home / Self Care | Attending: Gastroenterology

## 2021-06-29 ENCOUNTER — Ambulatory Visit: Payer: Medicare Other | Admitting: Certified Registered Nurse Anesthetist

## 2021-06-29 ENCOUNTER — Ambulatory Visit
Admission: RE | Admit: 2021-06-29 | Discharge: 2021-06-29 | Disposition: A | Discharge status: Home / Self Care | Payer: Medicare Other | Attending: Gastroenterology | Admitting: Gastroenterology

## 2021-06-29 DIAGNOSIS — K529 Noninfective gastroenteritis and colitis, unspecified: Secondary | ICD-10-CM

## 2021-06-29 DIAGNOSIS — Z7722 Contact with and (suspected) exposure to environmental tobacco smoke (acute) (chronic): Secondary | ICD-10-CM | POA: Insufficient documentation

## 2021-06-29 DIAGNOSIS — K5090 Crohn's disease, unspecified, without complications: Secondary | ICD-10-CM | POA: Insufficient documentation

## 2021-06-29 DIAGNOSIS — E119 Type 2 diabetes mellitus without complications: Secondary | ICD-10-CM | POA: Insufficient documentation

## 2021-06-29 DIAGNOSIS — R195 Other fecal abnormalities: Secondary | ICD-10-CM

## 2021-06-29 DIAGNOSIS — K6389 Other specified diseases of intestine: Secondary | ICD-10-CM | POA: Diagnosis not present

## 2021-06-29 DIAGNOSIS — Z7984 Long term (current) use of oral hypoglycemic drugs: Secondary | ICD-10-CM | POA: Diagnosis not present

## 2021-06-29 DIAGNOSIS — E669 Obesity, unspecified: Secondary | ICD-10-CM | POA: Diagnosis not present

## 2021-06-29 DIAGNOSIS — K5000 Crohn's disease of small intestine without complications: Secondary | ICD-10-CM | POA: Diagnosis not present

## 2021-06-29 DIAGNOSIS — Z1211 Encounter for screening for malignant neoplasm of colon: Secondary | ICD-10-CM | POA: Diagnosis present

## 2021-06-29 DIAGNOSIS — Z6837 Body mass index (BMI) 37.0-37.9, adult: Secondary | ICD-10-CM | POA: Diagnosis not present

## 2021-06-29 DIAGNOSIS — K6289 Other specified diseases of anus and rectum: Secondary | ICD-10-CM | POA: Diagnosis not present

## 2021-06-29 DIAGNOSIS — I10 Essential (primary) hypertension: Secondary | ICD-10-CM | POA: Insufficient documentation

## 2021-06-29 HISTORY — PX: COLONOSCOPY WITH PROPOFOL: SHX5780

## 2021-06-29 LAB — GLUCOSE, CAPILLARY: Glucose-Capillary: 117 mg/dL — ABNORMAL HIGH (ref 70–99)

## 2021-06-29 LAB — POCT PREGNANCY, URINE: Preg Test, Ur: NEGATIVE

## 2021-06-29 SURGERY — COLONOSCOPY WITH PROPOFOL
Anesthesia: General

## 2021-06-29 MED ORDER — PROPOFOL 500 MG/50ML IV EMUL
INTRAVENOUS | Status: DC | PRN
  Administered 2021-06-29: 150 ug/kg/min via INTRAVENOUS

## 2021-06-29 MED ORDER — SODIUM CHLORIDE 0.9 % IV SOLN: INTRAVENOUS | Status: DC

## 2021-06-29 MED ORDER — LIDOCAINE HCL (PF) 2 % IJ SOLN
INTRAMUSCULAR | Status: AC
  Filled 2021-06-29: qty 5

## 2021-06-29 MED ORDER — PROPOFOL 500 MG/50ML IV EMUL
INTRAVENOUS | Status: AC
  Filled 2021-06-29: qty 50

## 2021-06-29 MED ORDER — PROPOFOL 10 MG/ML IV BOLUS
INTRAVENOUS | Status: DC | PRN
  Administered 2021-06-29: 30 mg via INTRAVENOUS
  Administered 2021-06-29: 20 mg via INTRAVENOUS
  Administered 2021-06-29: 10 mg via INTRAVENOUS
  Administered 2021-06-29: 60 mg via INTRAVENOUS

## 2021-06-29 MED ORDER — LIDOCAINE HCL (CARDIAC) PF 100 MG/5ML IV SOSY
PREFILLED_SYRINGE | INTRAVENOUS | Status: DC | PRN
  Administered 2021-06-29: 50 mg via INTRAVENOUS

## 2021-06-29 NOTE — Transfer of Care (Signed)
Immediate Anesthesia Transfer of Care Note ? ?Patient: Susan Garrison ? ?Procedure(s) Performed: COLONOSCOPY WITH PROPOFOL ? ?Patient Location: Endoscopy Unit ? ?Anesthesia Type:General ? ?Level of Consciousness: awake and alert  ? ?Airway & Oxygen Therapy: Patient Spontanous Breathing ? ?Post-op Assessment: Report given to RN and Post -op Vital signs reviewed and stable ? ?Post vital signs: Reviewed and stable ? ?Last Vitals:  ?Vitals Value Taken Time  ?BP 76/52 06/29/21 1022  ?Temp    ?Pulse 83 06/29/21 1023  ?Resp 21 06/29/21 1023  ?SpO2 99 % 06/29/21 1023  ?Vitals shown include unvalidated device data. ? ?Last Pain:  ?Vitals:  ? 06/29/21 0841  ?TempSrc: Temporal  ?PainSc: 0-No pain  ?   ? ?  ? ?Complications: No notable events documented. ?

## 2021-06-29 NOTE — Anesthesia Postprocedure Evaluation (Signed)
Anesthesia Post Note ? ?Patient: Susan Garrison ? ?Procedure(s) Performed: COLONOSCOPY WITH PROPOFOL ? ?Patient location during evaluation: Endoscopy ?Anesthesia Type: General ?Level of consciousness: awake and alert ?Pain management: pain level controlled ?Vital Signs Assessment: post-procedure vital signs reviewed and stable ?Respiratory status: spontaneous breathing, nonlabored ventilation and respiratory function stable ?Cardiovascular status: blood pressure returned to baseline and stable ?Postop Assessment: no apparent nausea or vomiting ?Anesthetic complications: no ? ? ?No notable events documented. ? ? ?Last Vitals:  ?Vitals:  ? 06/29/21 1031 06/29/21 1048  ?BP: 94/75 116/77  ?Pulse:  64  ?Resp:    ?Temp:    ?SpO2:    ?  ?Last Pain:  ?Vitals:  ? 06/29/21 1048  ?TempSrc:   ?PainSc: 0-No pain  ? ? ?  ?  ?  ?  ?  ?  ? ?[place] Ouch ? ? ? ? ?

## 2021-06-29 NOTE — Anesthesia Preprocedure Evaluation (Addendum)
Anesthesia Evaluation  ?Patient identified by MRN, date of birth, ID band ?Patient awake ? ?General Assessment Comment:Alert, needs direction from caretaker to remember location. Unaware of procedure occurring or date.  ? ?Reviewed: ?Allergy & Precautions, NPO status , Patient's Chart, lab work & pertinent test results, reviewed documented beta blocker date and time  ? ?Airway ?Mallampati: III ? ?TM Distance: >3 FB ?Neck ROM: full ? ? ? Dental ? ?(+) Missing,  ?  ?Pulmonary ?neg pulmonary ROS,  ?  ?Pulmonary exam normal ? ? ? ? ? ? ? Cardiovascular ?Exercise Tolerance: Good ?hypertension, Pt. on medications and Pt. on home beta blockers ?Normal cardiovascular exam ? ? ?  ?Neuro/Psych ?mild cognitive impairmentnegative neurological ROS ?   ? GI/Hepatic ?Neg liver ROS, occult blood in stool  ?  ?Endo/Other  ?diabetes, Well Controlled, Type 2, Oral Hypoglycemic Agents ? Renal/GU ?negative Renal ROS  ?negative genitourinary ?  ?Musculoskeletal ? ? Abdominal ?(+) + obese,   ?Peds ? Hematology ?negative hematology ROS ?(+)   ?Anesthesia Other Findings ?Past Medical History: ?No date: Hypertension ?No date: Mental deficiency ?No date: Obese ?05/31/2017: Right upper quadrant pain ? ?Past Surgical History: ?No date: ABDOMINAL SURGERY; Right ?    Comment:  unknown to date of surgery ? ? ? ? Reproductive/Obstetrics ?negative OB ROS ? ?  ? ? ? ? ? ? ? ? ? ? ? ? ? ?  ?  ? ? ? ? ? ? ?Anesthesia Physical ?Anesthesia Plan ? ?ASA: 2 ? ?Anesthesia Plan: General  ? ?Post-op Pain Management:   ? ?Induction: Intravenous ? ?PONV Risk Score and Plan: Propofol infusion and TIVA ? ?Airway Management Planned: Natural Airway and Simple Face Mask ? ?Additional Equipment:  ? ?Intra-op Plan:  ? ?Post-operative Plan:  ? ?Informed Consent: I have reviewed the patients History and Physical, chart, labs and discussed the procedure including the risks, benefits and alternatives for the proposed anesthesia with the  patient or authorized representative who has indicated his/her understanding and acceptance.  ? ? ? ?Dental advisory given ? ?Plan Discussed with: Anesthesiologist, CRNA and Surgeon ? ?Anesthesia Plan Comments:   ? ? ? ? ? ?Anesthesia Quick Evaluation ? ?

## 2021-06-29 NOTE — H&P (Signed)
?Cephas Darby, MD ?[address]  ?[address]  ?[place], [place] [address]  ?Main: [phone]  ?Fax: [phone] ?Pager: [phone] ? ?Primary Care Physician:  Leonard Downing, MD ?Primary Gastroenterologist:  Dr. Cephas Darby ? ?Pre-Procedure History & Physical: ?HPI:  Susan Garrison is a 46 y.o. female is here for an colonoscopy. ?  ?Past Medical History:  ?Diagnosis Date  ? Hypertension   ? Mental deficiency   ? Obese   ? Right upper quadrant pain 05/31/2017  ? ? ?Past Surgical History:  ?Procedure Laterality Date  ? ABDOMINAL SURGERY Right   ? unknown to date of surgery  ? ? ?Prior to Admission medications   ?Medication Sig Start Date End Date Taking? Authorizing Provider  ?allopurinol (ZYLOPRIM) 300 MG tablet  05/21/21  Yes [provider]  ?atenolol (TENORMIN) 100 MG tablet Take 100 mg by mouth daily.   Yes [provider]  ?FARXIGA 10 MG TABS tablet Take 10 mg by mouth daily. 06/08/21  Yes [provider]  ?lovastatin (MEVACOR) 40 MG tablet  05/21/21  Yes [provider]  ?metFORMIN (GLUCOPHAGE) 500 MG tablet Take 500 mg by mouth daily with breakfast.   Yes [provider]  ?olmesartan-hydrochlorothiazide (BENICAR HCT) 40-12.5 MG tablet  05/21/21  Yes [provider]  ?Acetaminophen (TYLENOL ARTHRITIS PAIN PO) Take 1 tablet by mouth daily as needed. For pain.    [provider]  ?Cholecalciferol (VITAMIN D3) 2000 units TABS Take 2,000 Units by mouth daily.    [provider]  ? ? ?Allergies as of 06/10/2021  ? (No Known Allergies)  ? ? ?Family History  ?Problem Relation Age of Onset  ? Diabetes Mother   ? ? ?Social History  ? ?Socioeconomic History  ? Marital status: Single  ?  Spouse name: Not on file  ? Number of children: Not on file  ? Years of education: Not on file  ? Highest education level: Not on file  ?Occupational History  ? Not on file  ?Tobacco Use  ? Smoking status: Passive Smoke Exposure - Never Smoker  ?  Smokeless tobacco: Never  ?Vaping Use  ? Vaping Use: Never used  ?Substance and Sexual Activity  ? Alcohol use: No  ? Drug use: No  ? Sexual activity: Not on file  ?Other Topics Concern  ? Not on file  ?Social History Narrative  ? Not on file  ? ?Social Determinants of Health  ? ?Financial Resource Strain: Not on file  ?Food Insecurity: Not on file  ?Transportation Needs: Not on file  ?Physical Activity: Not on file  ?Stress: Not on file  ?Social Connections: Not on file  ?Intimate Partner Violence: Not on file  ? ? ?Review of Systems: ?See HPI, otherwise negative ROS ? ?Physical Exam: ?BP (!) 116/52   Pulse 89   Temp (!) 96.6 ?F (35.9 ?C) (Temporal)   Resp 18   Ht 5\' 5"  (1.651 m)   Wt 102.1 kg   SpO2 100%   BMI 37.44 kg/m?  ?General:   Alert,  pleasant and cooperative in NAD ?Head:  Normocephalic and atraumatic. ?Neck:  Supple; no masses or thyromegaly. ?Lungs:  Clear throughout to auscultation.    ?Heart:  Regular rate and rhythm. ?Abdomen:  Soft, nontender and nondistended. Normal bowel sounds, without guarding, and without rebound.   ?Neurologic:  Alert and  oriented x4;  grossly normal neurologically. ? ?Impression/Plan: ?Susan Garrison is here for an colonoscopy to be performed for FIT  positive ? ?Risks, benefits, limitations, and alternatives regarding  colonoscopy have been reviewed with the patient.  Questions have been answered.  All parties agreeable. ? ? ?Sherri Sear, MD  06/29/2021, 9:44 AM ?

## 2021-06-29 NOTE — Op Note (Signed)
[facility] ?Gastroenterology ?Patient Name: Susan Garrison ?Procedure Date: 06/29/2021 9:46 AM ?MRN: [id number] ?Account #: [id number] ?Date of Birth: 24-Apr-1975 ?Admit Type: Outpatient ?Age: 46 ?Room: [facility] ENDO ROOM 1 ?Gender: Female ?Note Status: Finalized ?Instrument Name: Colonoscope [id number] ?Procedure:             Colonoscopy ?Indications:           This is the patient's first colonoscopy, Heme positive  ?                       stool ?Providers:             Lin Landsman MD, MD ?Referring MD:          Leonard Downing (Referring MD) ?Medicines:             General Anesthesia ?Complications:         No immediate complications. Estimated blood loss: None. ?Procedure:             Pre-Anesthesia Assessment: ?                       - Prior to the procedure, a History and Physical was  ?                       performed, and patient medications and allergies were  ?                       reviewed. The patient is unable to give consent  ?                       secondary to the patient being legally incompetent to  ?                       consent. The risks and benefits of the procedure and  ?                       the sedation options and risks were discussed with the  ?                       patient's grandparent. All questions were answered and  ?                       informed consent was obtained. Patient identification  ?                       and proposed procedure were verified by the physician,  ?                       the nurse, the anesthesiologist, the anesthetist and  ?                       the technician in the pre-procedure area in the  ?                       procedure room in the endoscopy suite. Mental Status  ?                       Examination: alert and oriented. Airway Examination:  ?  normal oropharyngeal airway and neck mobility.  ?                       Respiratory Examination: clear to auscultation. CV  ?                       Examination:  normal. Prophylactic Antibiotics: The  ?                       patient does not require prophylactic antibiotics.  ?                       Prior Anticoagulants: The patient has taken no  ?                       previous anticoagulant or antiplatelet agents. ASA  ?                       Grade Assessment: II - A patient with mild systemic  ?                       disease. After reviewing the risks and benefits, the  ?                       patient was deemed in satisfactory condition to  ?                       undergo the procedure. The anesthesia plan was to use  ?                       general anesthesia. Immediately prior to  ?                       administration of medications, the patient was  ?                       re-assessed for adequacy to receive sedatives. The  ?                       heart rate, respiratory rate, oxygen saturations,  ?                       blood pressure, adequacy of pulmonary ventilation, and  ?                       response to care were monitored throughout the  ?                       procedure. The physical status of the patient was  ?                       re-assessed after the procedure. ?                       After obtaining informed consent, the colonoscope was  ?                       passed under direct vision. Throughout the procedure,  ?  the patient's blood pressure, pulse, and oxygen  ?                       saturations were monitored continuously. The  ?                       Colonoscope was introduced through the anus and  ?                       advanced to the the terminal ileum, with  ?                       identification of the appendiceal orifice and IC  ?                       valve. The colonoscopy was performed without  ?                       difficulty. The patient tolerated the procedure well.  ?                       The quality of the bowel preparation was evaluated  ?                       using the BBPS [facility] Bowel Preparation  Scale) with  ?                       scores of: Right Colon = 3, Transverse Colon = 3 and  ?                       Left Colon = 3 (entire mucosa seen well with no  ?                       residual staining, small fragments of stool or opaque  ?                       liquid). The total BBPS score equals 9. ?Findings: ?     The perianal and digital rectal examinations were normal. Pertinent  ?     negatives include normal sphincter tone and no palpable rectal lesions. ?     The terminal ileum contained multiple localized non-bleeding aphthae. No  ?     stigmata of recent bleeding were seen. Biopsies were taken with a cold  ?     forceps for histology. ?     Severe mucosal changes characterized by congestion (edema) and  ?     friability were found at the ileocecal valve. ?     Segmental and patchy mild mucosal changes characterized by congestion  ?     (edema) and erythema were found in the entire colon, predominantly in  ?     the cecum, sigmoid colon, rectosigmoid colon. Biopsies were taken with a  ?     cold forceps for histology. ?     The retroflexed view of the distal rectum and anal verge was normal and  ?     showed no anal or rectal abnormalities. ?Impression:            - Aphtha in the terminal ileum. Biopsied. ?                       -  Severe mucosal changes were found at the ileocecal  ?                       valve secondary to Crohn's disease with ileitis. ?                       - Segmental and patchy mild mucosal changes were found  ?                       in the entire examined colon secondary to Crohn's  ?                       disease with colonic involvement. Biopsied. ?                       - The distal rectum and anal verge are normal on  ?                       retroflexion view. ?Recommendation:        - Discharge patient to home (with escort). ?                       - Resume previous diet today. ?                       - Continue present medications. ?                       - Await pathology  results. ?                       - Return to my office in 3 weeks. ?Procedure Code(s):     --- Professional --- ?                       [phone], Colonoscopy, flexible; with biopsy, single or  ?                       multiple ?Diagnosis Code(s):     --- Professional --- ?                       K63.89, Other specified diseases of intestine ?                       K50.00, Crohn's disease of small intestine without  ?                       complications ?                       K50.10, Crohn's disease of large intestine without  ?                       complications ?                       R19.5, Other fecal abnormalities ?CPT copyright 2019 American Medical Association. All rights reserved. ?The codes documented in this report are preliminary and upon coder review may  ?be revised to meet current compliance requirements. ?Dr. Ulyess Mort ?Lin Landsman MD, MD ?06/29/2021 10:19:45 AM ?  This report has been signed electronically. ?Number of Addenda: 0 ?Note Initiated On: 06/29/2021 9:46 AM ?Scope Withdrawal Time: 0 hours 12 minutes 22 seconds  ?Total Procedure Duration: 0 hours 14 minutes 55 seconds  ?Estimated Blood Loss:  Estimated blood loss: none. ?     [facility] ?

## 2021-06-29 NOTE — Anesthesia Procedure Notes (Signed)
Procedure Name: [place] ?Date/Time: 06/29/2021 9:49 AM ?Performed by: Tollie Eth, CRNA ?Pre-anesthesia Checklist: Patient identified, Emergency Drugs available, Suction available and Patient being monitored ?Patient Re-evaluated:Patient Re-evaluated prior to induction ?Oxygen Delivery Method: Nasal cannula ?Induction Type: IV induction ?Placement Confirmation: positive ETCO2 ? ? ? ? ?

## 2021-06-30 ENCOUNTER — Telehealth: Payer: Self-pay

## 2021-06-30 DIAGNOSIS — K529 Noninfective gastroenteritis and colitis, unspecified: Secondary | ICD-10-CM

## 2021-06-30 LAB — SURGICAL PATHOLOGY

## 2021-06-30 NOTE — Telephone Encounter (Signed)
Patient mother verbalized understanding. She states they will come for the stool kits tomorrow. Order stool test  ?

## 2021-06-30 NOTE — Telephone Encounter (Signed)
-----   Message from Lin Landsman, MD sent at 06/30/2021  1:02 PM EDT ----- ?Caryl Pina, ? ?Please inform patient's grandmother who is her guardian that the pathology results from colonoscopy did not confirm Crohn's disease.  However, she did have mild inflammation in her colon.  Recommend to check stool studies GI profile PCR as well as calprotectin levels ? ?Rohini Vanga ?

## 2021-07-12 LAB — GI PROFILE, STOOL, PCR

## 2021-07-12 LAB — CALPROTECTIN, FECAL: Calprotectin, Fecal: 182 ug/g — ABNORMAL HIGH (ref 0–120)

## 2021-07-13 ENCOUNTER — Telehealth: Payer: Self-pay

## 2021-07-13 NOTE — Telephone Encounter (Signed)
Tried to call patient 3 times and it said the number I am calling is not accepting our call.  ?

## 2021-07-13 NOTE — Telephone Encounter (Signed)
-----   Message from Lin Landsman, MD sent at 07/13/2021 11:17 AM EDT ----- ?Please inform patient's grandmother that an of the stool test confirms that she has inflammation in her intestines.  Stool studies were negative for infection.  I should see her for follow-up on 4/17 to discuss next steps with regards to treatment options ? ?Rohini Vanga ?

## 2021-08-03 ENCOUNTER — Ambulatory Visit (INDEPENDENT_AMBULATORY_CARE_PROVIDER_SITE_OTHER): Payer: Medicare Other | Admitting: Gastroenterology

## 2021-08-03 ENCOUNTER — Encounter: Payer: Self-pay | Admitting: Gastroenterology

## 2021-08-03 VITALS — BP 137/81 | HR 69 | Temp 98.1°F | Ht 65.0 in | Wt 226.0 lb

## 2021-08-03 DIAGNOSIS — R195 Other fecal abnormalities: Secondary | ICD-10-CM

## 2021-08-03 DIAGNOSIS — R1011 Right upper quadrant pain: Secondary | ICD-10-CM | POA: Diagnosis not present

## 2021-08-03 DIAGNOSIS — K529 Noninfective gastroenteritis and colitis, unspecified: Secondary | ICD-10-CM

## 2021-08-03 NOTE — Progress Notes (Addendum)
lft ?  ?Susan Darby, MD ?[address]  ?[address]  ?[place], [place] [address]  ?Main: [phone]  ?Fax: [phone] ? ? ? ?Gastroenterology Consultation ? ?Referring Provider:     Leonard Downing, * ?Primary Care Physician:  Leonard Downing, MD ?Primary Gastroenterologist:  Dr. Cephas Garrison ?Reason for Consultation: Discuss pathology results ?      ? HPI:   ?Susan Garrison is a 46 y.o. female referred by Dr. Arelia Sneddon, Curt Jews, MD  for consultation & management of fecal occult positive test.  Patient has history of mild cognitive impairment, accompanied by her grandmother who is her caretaker.  Patient denies any GI symptoms.  No known history of anemia.  She denies any constipation, diarrhea.  She denies any rectal bleeding.  She had a surgery in her abdomen after her birth. ? ?Follow-up visit 08/03/2021 ?Patient underwent colonoscopy for FOBT positive.  Found to have inflammation in the colon as well as aphthous ulcers in the terminal ileum concerning for Crohn's disease.  However, the biopsies revealed active ileitis and mild patchy active colitis and proctitis only.  There was no evidence of granulomas or background chronicity.  Her GI profile PCR was negative for infection.  Fecal calprotectin levels were elevated at 182.  Patient reports having right upper quadrant discomfort radiating to right lower back, worse after eating.  She does acknowledge eating fatty foods, red meat on a regular basis as well as carbonated beverages on a daily basis.  She has been gaining weight.  No evidence of cholelithiasis but she has a contracted gallbladder based on the CT scan in 05/2017.  Patient does not have any other GI symptoms.  Patient is accompanied by her grandmother who is her healthcare power of attorney ? ? ?NSAIDs: None ? ?Antiplts/Anticoagulants/Anti thrombotics: None ? ?GI Procedures:  ?Colonoscopy 06/29/2021 ?- Aphtha in the terminal ileum. Biopsied. ?- Severe mucosal changes were  found at the ileocecal valve secondary to Crohn's disease with ileitis. Segmental and patchy mild mucosal changes were found in the entire examined colon secondary to Crohn's disease with colonic involvement. Biopsied. ?- The distal rectum and anal verge are normal on retroflexion view. ?DIAGNOSIS:  ?A. TERMINAL ILEUM; COLD BIOPSY:  ?- FOCAL MILD ACTIVE ENTERITIS.  ?- NEGATIVE FOR ARCHITECTURAL FEATURES OF CHRONICITY, DYSPLASIA, AND  ?MALIGNANCY.  ?- SEE COMMENT.  ? ?B.  COLON, RIGHT; COLD BIOPSY:  ?- COLONIC MUCOSA WITH FOCAL REACTIVE LYMPHOID AGGREGATES.  ?- NEGATIVE FOR ACTIVE COLITIS, ARCHITECTURAL FEATURES OF CHRONICITY,  ?DYSPLASIA, AND MALIGNANCY.  ? ?C.  COLON, LEFT; COLD BIOPSY:  ?- PATCHY MILD ACTIVE COLITIS WITH REACTIVE LYMPHOID AGGREGATES INVOLVING  ?A MINORITY OF THE BIOPSY FRAGMENTS.  ?- NEGATIVE FOR ARCHITECTURAL FEATURES OF CHRONICITY, DYSPLASIA, AND  ?MALIGNANCY.  ?- SEE COMMENT.  ? ?D.  RECTUM; COLD BIOPSY:  ?- MILD ACTIVE PROCTITIS INVOLVING A MINORITY OF THE BIOPSY FRAGMENTS.  ?- NEGATIVE FOR ARCHITECTURAL FEATURES OF CHRONICITY, DYSPLASIA, AND  ?MALIGNANCY.  ?- SEE COMMENT.  ? ?Comment:  ?The differential diagnosis for the above findings includes infection,  ?medication/drug-associated injury (NSAID vs other), diverticular  ?disease-associated colitis, vascular insufficiency, and early  ?inflammatory bowel disease. Correlation with clinical impression and  ?appropriate microbiology tests is required.  ? ?No known family history of GI malignancy ? ?Past Medical History:  ?Diagnosis Date  ? Hypertension   ? Mental deficiency   ? Obese   ? Right upper quadrant pain 05/31/2017  ? ? ?Past Surgical History:  ?Procedure Laterality  Date  ? ABDOMINAL SURGERY Right   ? unknown to date of surgery  ? COLONOSCOPY WITH PROPOFOL N/A 06/29/2021  ? Procedure: COLONOSCOPY WITH PROPOFOL;  Surgeon: Lin Landsman, MD;  Location: [facility] ENDOSCOPY;  Service: Gastroenterology;  Laterality: N/A;  [place]  [employer] ?[place] [phone])  ? ?Current Outpatient Medications:  ?  Acetaminophen (TYLENOL ARTHRITIS PAIN PO), Take 1 tablet by mouth daily as needed. For pain., Disp: , Rfl:  ?  allopurinol (ZYLOPRIM) 300 MG tablet, , Disp: , Rfl:  ?  atenolol (TENORMIN) 100 MG tablet, Take 100 mg by mouth daily., Disp: , Rfl:  ?  Cholecalciferol (VITAMIN D3) 2000 units TABS, Take 2,000 Units by mouth daily., Disp: , Rfl:  ?  FARXIGA 10 MG TABS tablet, Take 10 mg by mouth daily., Disp: , Rfl:  ?  lovastatin (MEVACOR) 40 MG tablet, , Disp: , Rfl:  ?  metFORMIN (GLUCOPHAGE) 500 MG tablet, Take 500 mg by mouth daily with breakfast., Disp: , Rfl:  ?  olmesartan-hydrochlorothiazide (BENICAR HCT) 40-12.5 MG tablet, , Disp: , Rfl: ' ?Family History  ?Problem Relation Age of Onset  ? Diabetes Mother   ?  ? ?Social History  ? ?Tobacco Use  ? Smoking status: Passive Smoke Exposure - Never Smoker  ? Smokeless tobacco: Never  ?Vaping Use  ? Vaping Use: Never used  ?Substance Use Topics  ? Alcohol use: No  ? Drug use: No  ? ? ?Allergies as of 08/03/2021  ? (No Known Allergies)  ? ? ?Review of Systems:    ?All systems reviewed and negative except where noted in HPI. ? ? Physical Exam:  ?BP 137/81 (BP Location: Left Arm, Patient Position: Sitting, Cuff Size: Normal)   Pulse 69   Temp 98.1 ?F (36.7 ?C) (Oral)   Ht 5\' 5"  (1.651 m)   Wt 226 lb (102.5 kg)   BMI 37.61 kg/m?  ?No LMP recorded. ? ?General:   Alert,  Well-developed, well-nourished, pleasant and cooperative in NAD ?Head:  Normocephalic and atraumatic. ?Eyes:  Sclera clear, no icterus.   Conjunctiva pink. ?Ears:  Normal auditory acuity. ?Nose:  No deformity, discharge, or lesions. ?Mouth:  No deformity or lesions,oropharynx pink & moist. ?Neck:  Supple; no masses or thyromegaly. ?Lungs:  Respirations even and unlabored.  Clear throughout to auscultation.   No wheezes, crackles, or rhonchi. No acute distress. ?Heart:  Regular rate and rhythm; no murmurs,  clicks, rubs, or gallops. ?Abdomen:  Normal bowel sounds. Soft, non-tender and non-distended without masses, hepatosplenomegaly or hernias noted.  No guarding or rebound tenderness.   ?Rectal: Not performed ?Msk:  Symmetrical without gross deformities. Good, equal movement & strength bilaterally. ?Pulses:  Normal pulses noted. ?Extremities:  No clubbing or edema.  No cyanosis. ?Neurologic:  Alert and oriented x3;  grossly normal neurologically. ?Skin:  Intact without significant lesions or rashes. No jaundice. ?Psych:  Alert and cooperative. Normal mood and affect. ? ?Imaging Studies: ?Reviewed ? ?Assessment and Plan:  ? ?Susan Garrison is a 46 y.o. female with cognitive impairment, metabolic syndrome is seen in for follow-up of FOBT positive stool ? ?Indeterminate colitis and enteritis based on the colonoscopy and pathology results ?Elevated fecal calprotectin levels ?Patient is asymptomatic without having any diarrhea or anemia ?GI profile PCR negative for infection ?Repeat fecal calprotectin levels in 2 weeks, if persistently elevated, recommend to start mesalamine therapy and repeat colonoscopy or a sigmoidoscopy in 6 months ? ?Chronic right upper quadrant discomfort ?CT abdomen pelvis  in 2019 revealed small band of tissue along the gallbladder fossa which could represent small contracted gallbladder ?Recheck LFTs today ?Recommend right upper quadrant ultrasound, if this is negative recommend HIDA scan ? ?Follow up in 4 months ? ?Susan Darby, MD ? ?

## 2021-08-03 NOTE — Patient Instructions (Addendum)
Your RUQ ultrasound is schedule for 08/10/21 arrive to out patient imaging at 9:45am for a 10:00am scan. Nothing to eat or drink 6 to 8 hours before the scan.  ?Please drop off specimen in 2 weeks. our lab is open on Mondays 1pm to 4pm, Tuesdays 8:30am to 4pm. Wednesdays 8:30am to 4pm and thursdays 1pm to 4pm.  ? ?

## 2021-08-04 LAB — HEPATIC FUNCTION PANEL
ALT: 21 IU/L (ref 0–32)
AST: 25 IU/L (ref 0–40)
Albumin: 3.9 g/dL (ref 3.8–4.8)
Alkaline Phosphatase: 65 IU/L (ref 44–121)
Bilirubin Total: <0.2 mg/dL (ref 0.0–1.2)
Bilirubin, Direct: <0.1 mg/dL (ref 0.00–0.40)
Total Protein: 7.1 g/dL (ref 6.0–8.5)

## 2021-08-10 ENCOUNTER — Other Ambulatory Visit: Payer: Medicare Other

## 2021-08-12 ENCOUNTER — Ambulatory Visit
Admission: RE | Admit: 2021-08-12 | Discharge: 2021-08-12 | Disposition: A | Discharge status: Home / Self Care | Payer: Medicare Other | Source: Ambulatory Visit | Attending: Gastroenterology | Admitting: Gastroenterology

## 2021-08-12 DIAGNOSIS — R1011 Right upper quadrant pain: Secondary | ICD-10-CM | POA: Insufficient documentation

## 2021-08-12 DIAGNOSIS — R195 Other fecal abnormalities: Secondary | ICD-10-CM | POA: Diagnosis present

## 2021-08-12 DIAGNOSIS — K529 Noninfective gastroenteritis and colitis, unspecified: Secondary | ICD-10-CM | POA: Insufficient documentation

## 2021-08-13 ENCOUNTER — Telehealth: Payer: Self-pay

## 2021-08-13 DIAGNOSIS — R1011 Right upper quadrant pain: Secondary | ICD-10-CM

## 2021-08-13 NOTE — Telephone Encounter (Signed)
Tried to call patient mother on primary number and it said the number we are calling is not accepting our call. Patient mother is okay with referral and will come for labs  ?

## 2021-08-13 NOTE — Telephone Encounter (Signed)
-----   Message from Toney Reil, MD sent at 08/12/2021  5:20 PM EDT ----- ?Morrie Sheldon ? ?Please inform patient's mother that the ultrasound of her liver shows that she has fatty liver as well as contracted gallbladder with gallstones which can be seen with chronic cholecystitis.  This can explain pain in her right upper quadrant.  If she is agreeable, I recommend referral to general surgery to evaluate for cholecystectomy ? ?Let's also perform H. pylori breath test ? ?Thanks ?RV ?

## 2021-08-17 ENCOUNTER — Ambulatory Visit (INDEPENDENT_AMBULATORY_CARE_PROVIDER_SITE_OTHER): Payer: Medicare Other | Admitting: Surgery

## 2021-08-17 ENCOUNTER — Encounter: Payer: Self-pay | Admitting: Surgery

## 2021-08-17 VITALS — BP 134/81 | HR 83 | Temp 98.4°F | Ht 65.0 in | Wt 220.0 lb

## 2021-08-17 DIAGNOSIS — G8929 Other chronic pain: Secondary | ICD-10-CM

## 2021-08-17 DIAGNOSIS — K8020 Calculus of gallbladder without cholecystitis without obstruction: Secondary | ICD-10-CM

## 2021-08-17 DIAGNOSIS — R109 Unspecified abdominal pain: Secondary | ICD-10-CM

## 2021-08-17 DIAGNOSIS — R1011 Right upper quadrant pain: Secondary | ICD-10-CM

## 2021-08-17 DIAGNOSIS — K811 Chronic cholecystitis: Secondary | ICD-10-CM

## 2021-08-17 HISTORY — DX: Chronic cholecystitis: K81.1

## 2021-08-17 NOTE — Progress Notes (Signed)
Patient ID: Susan Garrison, Garrison   DOB: 01/15/1976, 46 y.o.   MRN: [id number] ? ?HPI ?Susan Garrison seen in consultation at the request of Dr. Allegra Lai for right upper quadrant pain.  Patient reports she has had about 46-month history of right upper quadrant pain.  Pain seems to be worse with certain activities.  She hurts when she plays ball.  Pain is moderate intensity intermittent.  This does not seem to be related to meals.  She denies any fevers and chills she denies any nausea or vomiting.  She did have positive Gallbladder and feces.  She did have a colonoscopy that I have personally reviewed showing evidence of  mucosal changes were found at the ileocecal valve secondary to Crohn's disease with ileitis. the biopsies revealed active ileitis and mild patchy active colitis and proctitis only.  There was no evidence of granulomas or background chronicity. ?She did have also an ultrasound that have personally reviewed showing evidence of cholelithiasis and contracted gallbladder.  No clear-cut evidence of cholecystitis.  She did have a CT scan 2019 that I personally reviewed showing remnant of a GB vs very small GB, without any acute intra-abdominal pathology. ?Patient is accompanied by her legal guardian/ power of attorney with whom she lives for the last 30 years or so. ? ?Past surgical history includes surgery when she was a baby which I think likely was a pyloromyotomy. No records available. ?LFTs were completely normal. ? ?She does have some cognitive impairments and disabilities.  Her power of attorney is making decisions for her.  She is able to perform more than 4 METS of activity without any shortness of breath or chest pain.  She eats on her own she plays outside. ? ? ? ?HPI ? ?Past Medical History:  ?Diagnosis Date  ? Hypertension   ? Mental deficiency   ? Obese   ? Right upper quadrant pain 05/31/2017  ? ? ?Past Surgical History:  ?Procedure Laterality Date  ? ABDOMINAL  SURGERY Right   ? unknown to date of surgery  ? COLONOSCOPY WITH PROPOFOL N/A 06/29/2021  ? Procedure: COLONOSCOPY WITH PROPOFOL;  Surgeon: Toney Reil, MD;  Location: [facility] ENDOSCOPY;  Service: Gastroenterology;  Laterality: N/A;  [place] [employer] ?PROGRAM MANAGER - ROBERT LEE [phone])  ? ? ?Family History  ?Problem Relation Age of Onset  ? Diabetes Mother   ? ? ?Social History ?Social History  ? ?Tobacco Use  ? Smoking status: Never  ?  Passive exposure: Yes  ? Smokeless tobacco: Never  ?Vaping Use  ? Vaping Use: Never used  ?Substance Use Topics  ? Alcohol use: No  ? Drug use: No  ? ? ?No Known Allergies ? ?Current Outpatient Medications  ?Medication Sig Dispense Refill  ? Acetaminophen (TYLENOL ARTHRITIS PAIN PO) Take 1 tablet by mouth daily as needed. For pain.    ? allopurinol (ZYLOPRIM) 300 MG tablet     ? atenolol (TENORMIN) 100 MG tablet Take 100 mg by mouth daily.    ? Cholecalciferol (VITAMIN D3) 2000 units TABS Take 2,000 Units by mouth daily.    ? FARXIGA 10 MG TABS tablet Take 10 mg by mouth daily.    ? lovastatin (MEVACOR) 40 MG tablet     ? metFORMIN (GLUCOPHAGE) 500 MG tablet Take 500 mg by mouth daily with breakfast.    ? olmesartan-hydrochlorothiazide (BENICAR HCT) 40-12.5 MG tablet     ? ?No current facility-administered medications for this visit.  ? ? ? ?  Review of Systems ?Full ROS  was asked and was negative except for the information on the HPI ? ?Physical Exam ?Blood pressure 134/81, pulse 83, temperature 98.4 ?F (36.9 ?C), height 5\' 5"  (1.651 m), weight 220 lb (99.8 kg), last menstrual period 08/05/2021, SpO2 98 %. ?CONSTITUTIONAL: NAD. ?EYES: Pupils are equal, round, and reactive to light, Sclera are non-icteric. ?EARS, NOSE, MOUTH AND THROAT: The oropharynx is clear. The oral mucosa is pink and moist. Hearing is intact to voice. ?LYMPH NODES:  Lymph nodes in the neck are normal. ?RESPIRATORY:  Lungs are clear. There is normal respiratory effort, with equal breath  sounds bilaterally, and without pathologic use of accessory muscles. ?CARDIOVASCULAR: Heart is regular without murmurs, gallops, or rubs. ?GI: The abdomen is  soft, TTP RUQ, w/o peritonitis or Murphy sign. There are no palpable masses. There is no hepatosplenomegaly. There are normal bowel sounds GU: Rectal deferred.  Epigastric scar likely from pyloromyotomy ?MUSCULOSKELETAL: Normal muscle strength and tone. No cyanosis or edema.   ?SKIN: Turgor is good and there are no pathologic skin lesions or ulcers. ?NEUROLOGIC: Motor and sensation is grossly normal. Cranial nerves are grossly intact. ?PSYCH:  Oriented to person, place and time. Affect is normal. ? ?Data Reviewed ? ?I have personally reviewed the patient's imaging, laboratory findings and medical records.   ? ?Assessment/Plan ?Susan Garrison is a 46 year old Garrison with chronic right upper quadrant pain at this time all unclear etiology.  [place] chronic cholecystitis can cause this.  Her symptoms are not classic I will like to obtain a CT scan of abdomen and pelvis to evaluate for any other intra-abdominal pathology and evaluate the GB remnant vs contracted GB.  Especially since she had occult blood in feces and prior abdominal operation as an infant. ?I Discussed with the patient and the guardian in detail about my thought process.  We also talked about the role of cholecystectomy and I did discuss with them what it entails.  They are in agreement with me if no other reasonable physical nation for the right upper quadrant pain is found we should proceed with cholecystectomy.  She does have evidence of cholelithiasis that may explain her symptoms. ? ?Please note that I spent 60 minutes in this encounter including personally reviewing imaging studies, reviewing medical records, coordinating her care, counseling the patient and the legal guardian, performing appropriate documentation and placing orders ?  ?48, MD FACS ?General Surgeon ?08/17/2021, 9:39 AM ? ?   ?

## 2021-08-17 NOTE — Patient Instructions (Addendum)
CT scheduled 08/27/21 @ 12:15 Outpatient Imaging. Nothing to eat/*drink 4 hours prior.  ?Please go pick up your prep kit and instructions for the CT today.  ?[place] ?[place] [address] ? ? ? ? ? ? ?Minimally Invasive Cholecystectomy ?Minimally invasive cholecystectomy is surgery to remove the gallbladder. The gallbladder is a pear-shaped organ that lies beneath the liver on the right side of the body. The gallbladder stores bile, which is a fluid that helps the body digest fats. Cholecystectomy is often done to treat inflammation (irritation and swelling) of the gallbladder (cholecystitis). This condition is usually caused by a buildup of gallstones (cholelithiasis) in the gallbladder or when the fluid in the gall bladder becomes stagnant because gallstones get stuck in the ducts (tubes) and block the flow of bile. This can result in inflammation and pain. In severe cases, emergency surgery may be required. ?This procedure is done through small incisions in the abdomen, instead of one large incision. It is also called laparoscopic surgery. A thin scope with a camera (laparoscope) is inserted through one incision. Then surgical instruments are inserted through the other incisions. In some cases, a minimally invasive surgery may need to be changed to a surgery that is done through a larger incision. This is called open surgery. ?Tell a health care provider about: ?Any allergies you have. ?All medicines you are taking, including vitamins, herbs, eye drops, creams, and over-the-counter medicines. ?Any problems you or family members have had with anesthetic medicines. ?Any bleeding problems you have. ?Any surgeries you have had. ?Any medical conditions you have. ?Whether you are pregnant or may be pregnant. ?What are the risks? ?Generally, this is a safe procedure. However, problems may occur, including: ?Infection. ?Bleeding. ?Allergic reactions to medicines. ?Damage to nearby structures or organs. ?A  gallstone remaining in the common bile duct. The common bile duct carries bile from the gallbladder to the small intestine. ?A bile leak from the liver or cystic duct after your gallbladder is removed. ?What happens before the procedure? ?When to stop eating and drinking ?Follow instructions from your health care provider about what you may eat and drink before your procedure. These may include: ?8 hours before the procedure ?Stop eating most foods. Do not eat meat, fried foods, or fatty foods. ?Eat only light foods, such as toast or crackers. ?All liquids are okay except energy drinks and alcohol. ?6 hours before the procedure ?Stop eating. ?Drink only clear liquids, such as water, clear fruit juice, black coffee, plain tea, and sports drinks. ?Do not drink energy drinks or alcohol. ?2 hours before the procedure ?Stop drinking all liquids. ?You may be allowed to take medicines with small sips of water. ?If you do not follow your health care provider's instructions, your procedure may be delayed or canceled. ?Medicines ?Ask your health care provider about: ?Changing or stopping your regular medicines. This is especially important if you are taking diabetes medicines or blood thinners. ?Taking medicines such as aspirin and ibuprofen. These medicines can thin your blood. Do not take these medicines unless your health care provider tells you to take them. ?Taking over-the-counter medicines, vitamins, herbs, and supplements. ?General instructions ?If you will be going home right after the procedure, plan to have a responsible adult: ?Take you home from the hospital or clinic. You will not be allowed to drive. ?Care for you for the time you are told. ?Do not use any products that contain nicotine or tobacco for at least 4 weeks before the procedure. These products  include cigarettes, chewing tobacco, and vaping devices, such as e-cigarettes. If you need help quitting, ask your health care provider. ?Ask your health care  provider: ?How your surgery site will be marked. ?What steps will be taken to help prevent infection. These may include: ?Removing hair at the surgery site. ?Washing skin with a germ-killing soap. ?Taking antibiotic medicine. ?What happens during the procedure? ? ?An IV will be inserted into one of your veins. ?You will be given one or both of the following: ?A medicine to help you relax (sedative). ?A medicine to make you fall asleep (general anesthetic). ?Your surgeon will make several small incisions in your abdomen. ?The laparoscope will be inserted through one of the small incisions. The camera on the laparoscope will send images to a monitor in the operating room. This lets your surgeon see inside your abdomen. ?A gas will be pumped into your abdomen. This will expand your abdomen to give the surgeon more room to perform the surgery. ?Other tools that are needed for the procedure will be inserted through the other incisions. The gallbladder will be removed through one of the incisions. ?Your common bile duct may be examined. If stones are found in the common bile duct, they may be removed. ?After your gallbladder has been removed, the incisions will be closed with stitches (sutures), staples, or skin glue. ?Your incisions will be covered with a bandage (dressing). ?The procedure may vary among health care providers and hospitals. ?What happens after the procedure? ?Your blood pressure, heart rate, breathing rate, and blood oxygen level will be monitored until you leave the hospital or clinic. ?You will be given medicines as needed to control your pain. ?You may have a drain placed in the incision. The drain will be removed a day or two after the procedure. ?Summary ?Minimally invasive cholecystectomy, also called laparoscopic cholecystectomy, is surgery to remove the gallbladder using small incisions. ?Tell your health care provider about all the medical conditions you have and all the medicines you are taking  for those conditions. ?Before the procedure, follow instructions about when to stop eating and drinking and changing or stopping medicines. ?Plan to have a responsible adult care for you for the time you are told after you leave the hospital or clinic. ?This information is not intended to replace advice given to you by your health care provider. Make sure you discuss any questions you have with your health care provider. ?Document Revised: 10/07/2020 Document Reviewed: 10/07/2020 ?Elsevier Patient Education ? [place]. ? ?

## 2021-08-19 LAB — H. PYLORI BREATH TEST: H pylori Breath Test: NEGATIVE

## 2021-08-20 ENCOUNTER — Telehealth: Payer: Self-pay

## 2021-08-20 NOTE — Telephone Encounter (Signed)
Tried to call patient primary number and it said that they do not accept our calls. Called mobile number and voicemail is not set up so unable to leave a message  ?

## 2021-08-20 NOTE — Telephone Encounter (Signed)
Patient grandmother verbalized understanding of results  ?

## 2021-08-20 NOTE — Telephone Encounter (Signed)
-----   Message from Lin Landsman, MD sent at 08/19/2021  4:18 PM EDT ----- ?H. pylori breath test came back negative.  Please remind patient's grandmother about stool specimen for fecal calprotectin level ? ?RV ?

## 2021-08-27 ENCOUNTER — Ambulatory Visit
Admission: RE | Admit: 2021-08-27 | Discharge: 2021-08-27 | Disposition: A | Discharge status: Home / Self Care | Payer: Medicare Other | Source: Ambulatory Visit | Attending: Surgery | Admitting: Surgery

## 2021-08-27 DIAGNOSIS — R109 Unspecified abdominal pain: Secondary | ICD-10-CM | POA: Insufficient documentation

## 2021-08-27 LAB — POCT I-STAT CREATININE: Creatinine, Ser: 1.4 mg/dL — ABNORMAL HIGH (ref 0.44–1.00)

## 2021-08-27 LAB — CALPROTECTIN, FECAL: Calprotectin, Fecal: 143 ug/g — ABNORMAL HIGH (ref 0–120)

## 2021-08-27 MED ORDER — IOHEXOL 300 MG/ML  SOLN
100.0000 mL | Freq: Once | INTRAMUSCULAR | Status: AC | PRN
  Administered 2021-08-27: 100 mL via INTRAVENOUS

## 2021-08-28 ENCOUNTER — Telehealth: Payer: Self-pay

## 2021-08-28 NOTE — Telephone Encounter (Signed)
-----   Message from Rohini Reddy Vanga, MD sent at 08/27/2021  5:16 PM EDT ----- ?Elevated fecal calprotectin levels.  Recommend to start Lialda 2.4 g twice daily for 4 months and recheck fecal calprotectin levels during next follow-up appointment in August ? ?RV ?

## 2021-08-28 NOTE — Telephone Encounter (Signed)
-----   Message from Lin Landsman, MD sent at 08/27/2021  5:16 PM EDT ----- ?Elevated fecal calprotectin levels.  Recommend to start Lialda 2.4 g twice daily for 4 months and recheck fecal calprotectin levels during next follow-up appointment in August ? ?RV ?

## 2021-08-28 NOTE — Telephone Encounter (Signed)
Primary number is not accepting our calls and mobile number does not have a voicemail set up  ?

## 2021-08-31 ENCOUNTER — Ambulatory Visit (INDEPENDENT_AMBULATORY_CARE_PROVIDER_SITE_OTHER): Payer: Medicare Other | Admitting: Surgery

## 2021-08-31 ENCOUNTER — Telehealth: Payer: Self-pay | Admitting: Surgery

## 2021-08-31 ENCOUNTER — Encounter: Payer: Self-pay | Admitting: Surgery

## 2021-08-31 ENCOUNTER — Telehealth: Payer: Self-pay | Admitting: Gastroenterology

## 2021-08-31 VITALS — BP 122/77 | HR 77 | Temp 98.0°F | Ht 65.0 in | Wt 226.0 lb

## 2021-08-31 DIAGNOSIS — K8020 Calculus of gallbladder without cholecystitis without obstruction: Secondary | ICD-10-CM | POA: Diagnosis not present

## 2021-08-31 MED ORDER — MESALAMINE 1.2 G PO TBEC: 2.4000 g | DELAYED_RELEASE_TABLET | Freq: Two times a day (BID) | ORAL | 3 refills | Status: DC

## 2021-08-31 NOTE — Progress Notes (Signed)
Request for Medical Clearance has been faxed to Dr [name].  ?

## 2021-08-31 NOTE — Telephone Encounter (Signed)
Patient grandmother verbalized understanding of results. Sent medication to the pharmacy  ?

## 2021-08-31 NOTE — Telephone Encounter (Signed)
Legal guardian, Marlowe Kays called back.  They are now informed of all dates regarding surgery and verbalized understanding.  ?

## 2021-08-31 NOTE — Telephone Encounter (Signed)
Called primary number and they are not accepting our called. Called mobile number and she states she has having gallbladder surgery on Thursday. She states she is not going to start till after the surgery  ?

## 2021-08-31 NOTE — Progress Notes (Signed)
Outpatient Surgical Follow Up ? ?08/31/2021 ? ?Susan Garrison is an 46 y.o. female.  ? ?Chief Complaint  ?Patient presents with  ? Follow-up  ? ? ?HPI: Is a 46 year old female well-known to me with prior history of bilateral myotomy as an infant and chronic right upper quadrant epigastric pain.  She comes with her guardian.  She is still having some intermittent pains in the epigastric area and right upper quadrant.  We did repeat a CT scan that have personally reviewed there is evidence of a small remanent of the gallbladder.  She also had a serous ultrasound showing evidence of gallstones.  I confirmed today with the guardian and she has never had her gallbladder taken out.  On the CT scan there is no clips.  I had an extensive discussion with Susan Garrison and also the guardian as well as Child psychotherapist over the phone.  Explained the situation.  They are in agreement to proceed with cholecystectomy. ? ?Past Medical History:  ?Diagnosis Date  ? Hypertension   ? Mental deficiency   ? Obese   ? Right upper quadrant pain 05/31/2017  ? ? ?Past Surgical History:  ?Procedure Laterality Date  ? ABDOMINAL SURGERY Right   ? unknown to date of surgery  ? COLONOSCOPY WITH PROPOFOL N/A 06/29/2021  ? Procedure: COLONOSCOPY WITH PROPOFOL;  Surgeon: Toney Reil, MD;  Location: [facility] ENDOSCOPY;  Service: Gastroenterology;  Laterality: N/A;  [employer] [employer] ?PROGRAM MANAGER - ROBERT LEE [phone])  ? ? ?Family History  ?Problem Relation Age of Onset  ? Diabetes Mother   ? ? ?Social History:  reports that she has never smoked. She has been exposed to tobacco smoke. She has never used smokeless tobacco. She reports that she does not drink alcohol and does not use drugs. ? ?Allergies: No Known Allergies ? ?Medications reviewed. ? ? ? ?ROS ?Full ROS performed and is otherwise negative other than what is stated in HPI ? ? ?BP 122/77   Pulse 77   Temp 98 ?F (36.7 ?C)   Ht 5\' 5"  (1.651 m)   Wt 226 lb (102.5 kg)    LMP 08/05/2021 (Exact Date)   SpO2 97%   BMI 37.61 kg/m?  ? ?Physical Exam ?CONSTITUTIONAL: NAD. ?EYES: Pupils are equal, round, and reactive to light, Sclera are non-icteric. ?EARS, NOSE, MOUTH AND THROAT: The oropharynx is clear. The oral mucosa is pink and moist. Hearing is intact to voice. ?LYMPH NODES:  Lymph nodes in the neck are normal. ?RESPIRATORY:  Lungs are clear. There is normal respiratory effort, with equal breath sounds bilaterally, and without pathologic use of accessory muscles. ?CARDIOVASCULAR: Heart is regular without murmurs, gallops, or rubs. ?GI: The abdomen is  soft, TTP RUQ, w/o peritonitis or Murphy sign. There are no palpable masses. There is no hepatosplenomegaly. There are normal bowel sounds GU: Rectal deferred.  Epigastric scar likely from pyloromyotomy ?MUSCULOSKELETAL: Normal muscle strength and tone. No cyanosis or edema.   ?SKIN: Turgor is good and there are no pathologic skin lesions or ulcers. ?NEUROLOGIC: Motor and sensation is grossly normal. Cranial nerves are grossly intact. ?PSYCH:  Oriented to person, place and time. Affect is normal. ? ? ?Assessment/Plan: ?I discussed the procedure in detail.  We discussed  with the guardian and care giver the risks and benefits of a laparoscopic cholecystectomy and possible cholangiogram including, but not limited to bleeding, infection, injury to surrounding structures such as the intestine or liver, bile leak, retained gallstones, need to convert to an open  procedure, prolonged diarrhea, blood clots such as  DVT, common bile duct injury, anesthesia risks, and possible need for additional procedures.  The likelihood of improvement in symptoms and return to the patient's normal status is good. We discussed the typical post-operative recovery course.  ?We will do surgery later this week ?Please note that I spent 40 minutes in this encounter including personally reviewing imaging studies, reviewing medical records, coordinating her care,  counseling the patient and the legal guardian, performing appropriate documentation and placing orders. ? ?  ? ?Sterling Big, MD FACS ?General Surgeon  ?

## 2021-08-31 NOTE — Patient Instructions (Signed)
You have requested to have your gallbladder removed. This will be done on 09/03/21 at [facility] with Dr. Everlene Farrier. ? ?You will most likely be out of work 1-2 weeks for this surgery.  ?If you have FMLA or disability paperwork that needs filled out you may drop this off at our office or this can be faxed to [phone]. ? ?You will return after your post-op appointment with a lifting restriction for approximately 4 more weeks. ? ?You will be able to eat anything you would like to following surgery. But, start by eating a bland diet and advance this as tolerated. The Gallbladder diet is below, please go as closely by this diet as possible prior to surgery to avoid any further attacks. ? ?Please see the (blue)pre-care form that you have been given today. Our surgery scheduler will call you to verify surgery date and to go over information.  ? ?If you have any questions, please call our office. ? ?Laparoscopic Cholecystectomy ?Laparoscopic cholecystectomy is surgery to remove the gallbladder. The gallbladder is located in the upper right part of the abdomen, behind the liver. It is a storage sac for bile, which is produced in the liver. Bile aids in the digestion and absorption of fats. Cholecystectomy is often done for inflammation of the gallbladder (cholecystitis). This condition is usually caused by a buildup of gallstones (cholelithiasis) in the gallbladder. Gallstones can block the flow of bile, and that can result in inflammation and pain. In severe cases, emergency surgery may be required. If emergency surgery is not required, you will have time to prepare for the procedure. ?Laparoscopic surgery is an alternative to open surgery. Laparoscopic surgery has a shorter recovery time. Your common bile duct may also need to be examined during the procedure. If stones are found in the common bile duct, they may be removed. ?LET [facility] CARE PROVIDER KNOW ABOUT: ?Any allergies you have. ?All medicines you are  taking, including vitamins, herbs, eye drops, creams, and over-the-counter medicines. ?Previous problems you or members of your family have had with the use of anesthetics. ?Any blood disorders you have. ?Previous surgeries you have had. ? ?Any medical conditions you have. ?RISKS AND COMPLICATIONS ?Generally, this is a safe procedure. However, problems may occur, including: ?Infection. ?Bleeding. ?Allergic reactions to medicines. ?Damage to other structures or organs. ?A stone remaining in the common bile duct. ?A bile leak from the cyst duct that is clipped when your gallbladder is removed. ?The need to convert to open surgery, which requires a larger incision in the abdomen. This may be necessary if your surgeon thinks that it is not safe to continue with a laparoscopic procedure. ?BEFORE THE PROCEDURE ?Ask your health care provider about: ?Changing or stopping your regular medicines. This is especially important if you are taking diabetes medicines or blood thinners. ?Taking medicines such as aspirin and ibuprofen. These medicines can thin your blood. Do not take these medicines before your procedure if your health care provider instructs you not to. ?Follow instructions from your health care provider about eating or drinking restrictions. ?Let your health care provider know if you develop a cold or an infection before surgery. ?Plan to have someone take you home after the procedure. ?Ask your health care provider how your surgical site will be marked or identified. ?You may be given antibiotic medicine to help prevent infection. ?PROCEDURE ?To reduce your risk of infection: ?Your health care team will wash or sanitize their hands. ?Your skin will be washed with soap. ?  An IV tube may be inserted into one of your veins. ?You will be given a medicine to make you fall asleep (general anesthetic). ?A breathing tube will be placed in your mouth. ?The surgeon will make several small cuts (incisions) in your abdomen. ?A  thin, lighted tube (laparoscope) that has a tiny camera on the end will be inserted through one of the small incisions. The camera on the laparoscope will send a picture to a TV screen (monitor) in the operating room. This will give the surgeon a good view inside your abdomen. ?A gas will be pumped into your abdomen. This will expand your abdomen to give the surgeon more room to perform the surgery. ?Other tools that are needed for the procedure will be inserted through the other incisions. The gallbladder will be removed through one of the incisions. ?After your gallbladder has been removed, the incisions will be closed with stitches (sutures), staples, or skin glue. ?Your incisions may be covered with a bandage (dressing). ?The procedure may vary among health care providers and hospitals. ?AFTER THE PROCEDURE ?Your blood pressure, heart rate, breathing rate, and blood oxygen level will be monitored often until the medicines you were given have worn off. ?You will be given medicines as needed to control your pain. ?  ?This information is not intended to replace advice given to you by your health care provider. Make sure you discuss any questions you have with your health care provider. ?  ?Document Released: 04/05/2005 Document Revised: 12/25/2014 Document Reviewed: 11/15/2012 ?Elsevier Interactive Patient Education ?2016 Elsevier Inc. ? ? ?Low-Fat Diet for Gallbladder Conditions ?A low-fat diet can be helpful if you have pancreatitis or a gallbladder condition. With these conditions, your pancreas and gallbladder have trouble digesting fats. A healthy eating plan with less fat will help rest your pancreas and gallbladder and reduce your symptoms. ?WHAT DO I NEED TO KNOW ABOUT THIS DIET? ?Eat a low-fat diet. ?Reduce your fat intake to less than 20-30% of your total daily calories. This is less than 50-60 g of fat per day. ?Remember that you need some fat in your diet. Ask your dietician what your daily goal should  be. ?Choose nonfat and low-fat healthy foods. Look for the words "nonfat," "low fat," or "fat free." ?As a guide, look on the label and choose foods with less than 3 g of fat per serving. Eat only one serving. ?Avoid alcohol. ?Do not smoke. If you need help quitting, talk with your health care provider. ?Eat small frequent meals instead of three large heavy meals. ?WHAT FOODS CAN I EAT? ?Grains ?Include healthy grains and starches such as potatoes, wheat bread, fiber-rich cereal, and brown rice. Choose whole grain options whenever possible. In adults, whole grains should account for 45-65% of your daily calories.  ?Fruits and Vegetables ?Eat plenty of fruits and vegetables. Fresh fruits and vegetables add fiber to your diet. ?Meats and Other Protein Sources ?Eat lean meat such as chicken and pork. Trim any fat off of meat before cooking it. Eggs, fish, and beans are other sources of protein. In adults, these foods should account for 10-35% of your daily calories. ?Dairy ?Choose low-fat milk and dairy options. Dairy includes fat and protein, as well as calcium.  ?Fats and Oils ?Limit high-fat foods such as fried foods, sweets, baked goods, sugary drinks.  ?Other ?Creamy sauces and condiments, such as mayonnaise, can add extra fat. Think about whether or not you need to use them, or use smaller amounts or low  fat options. ?WHAT FOODS ARE NOT RECOMMENDED? ?High fat foods, such as: ?[employer]. ?Ice cream. ?[place] toast. ?Sweet rolls. ?Pizza. ?Cheese bread. ?Foods covered with batter, butter, creamy sauces, or cheese. ?Fried foods. ?Sugary drinks and desserts. ?Foods that cause gas or bloating ?  ?This information is not intended to replace advice given to you by your health care provider. Make sure you discuss any questions you have with your health care provider. ?  ?Document Released: 04/10/2013 Document Reviewed: 04/10/2013 ?Elsevier Interactive Patient Education ?2016 Elsevier Inc. ? ? ?

## 2021-08-31 NOTE — H&P (View-Only) (Signed)
Outpatient Surgical Follow Up ? ?08/31/2021 ? ?Susan Garrison is an 46 y.o. female.  ? ?Chief Complaint  ?Patient presents with  ? Follow-up  ? ? ?HPI: Is a 46-year-old female well-known to me with prior history of bilateral myotomy as an infant and chronic right upper quadrant epigastric pain.  She comes with her guardian.  She is still having some intermittent pains in the epigastric area and right upper quadrant.  We did repeat a CT scan that have personally reviewed there is evidence of a small remanent of the gallbladder.  She also had a serous ultrasound showing evidence of gallstones.  I confirmed today with the guardian and she has never had her gallbladder taken out.  On the CT scan there is no clips.  I had an extensive discussion with Logen and also the guardian as well as social worker over the phone.  Explained the situation.  They are in agreement to proceed with cholecystectomy. ? ?Past Medical History:  ?Diagnosis Date  ? Hypertension   ? Mental deficiency   ? Obese   ? Right upper quadrant pain 05/31/2017  ? ? ?Past Surgical History:  ?Procedure Laterality Date  ? ABDOMINAL SURGERY Right   ? unknown to date of surgery  ? COLONOSCOPY WITH PROPOFOL N/A 06/29/2021  ? Procedure: COLONOSCOPY WITH PROPOFOL;  Surgeon: Vanga, Rohini Reddy, MD;  Location: [facility] ENDOSCOPY;  Service: Gastroenterology;  Laterality: N/A;  [employer] [employer] ?PROGRAM MANAGER - ROBERT LEE ([phone])  ? ? ?Family History  ?Problem Relation Age of Onset  ? Diabetes Mother   ? ? ?Social History:  reports that she has never smoked. She has been exposed to tobacco smoke. She has never used smokeless tobacco. She reports that she does not drink alcohol and does not use drugs. ? ?Allergies: No Known Allergies ? ?Medications reviewed. ? ? ? ?ROS ?Full ROS performed and is otherwise negative other than what is stated in HPI ? ? ?BP 122/77   Pulse 77   Temp 98 ?F (36.7 ?C)   Ht 5' 5" (1.651 m)   Wt 226 lb (102.5 kg)    LMP 08/05/2021 (Exact Date)   SpO2 97%   BMI 37.61 kg/m?  ? ?Physical Exam ?CONSTITUTIONAL: NAD. ?EYES: Pupils are equal, round, and reactive to light, Sclera are non-icteric. ?EARS, NOSE, MOUTH AND THROAT: The oropharynx is clear. The oral mucosa is pink and moist. Hearing is intact to voice. ?LYMPH NODES:  Lymph nodes in the neck are normal. ?RESPIRATORY:  Lungs are clear. There is normal respiratory effort, with equal breath sounds bilaterally, and without pathologic use of accessory muscles. ?CARDIOVASCULAR: Heart is regular without murmurs, gallops, or rubs. ?GI: The abdomen is  soft, TTP RUQ, w/o peritonitis or Murphy sign. There are no palpable masses. There is no hepatosplenomegaly. There are normal bowel sounds GU: Rectal deferred.  Epigastric scar likely from pyloromyotomy ?MUSCULOSKELETAL: Normal muscle strength and tone. No cyanosis or edema.   ?SKIN: Turgor is good and there are no pathologic skin lesions or ulcers. ?NEUROLOGIC: Motor and sensation is grossly normal. Cranial nerves are grossly intact. ?PSYCH:  Oriented to person, place and time. Affect is normal. ? ? ?Assessment/Plan: ?I discussed the procedure in detail.  We discussed  with the guardian and care giver the risks and benefits of a laparoscopic cholecystectomy and possible cholangiogram including, but not limited to bleeding, infection, injury to surrounding structures such as the intestine or liver, bile leak, retained gallstones, need to convert to an open   procedure, prolonged diarrhea, blood clots such as  DVT, common bile duct injury, anesthesia risks, and possible need for additional procedures.  The likelihood of improvement in symptoms and return to the patient's normal status is good. We discussed the typical post-operative recovery course.  ?We will do surgery later this week ?Please note that I spent 40 minutes in this encounter including personally reviewing imaging studies, reviewing medical records, coordinating her care,  counseling the patient and the legal guardian, performing appropriate documentation and placing orders. ? ?  ? ?Emaree Chiu, MD FACS ?General Surgeon  ?

## 2021-08-31 NOTE — Telephone Encounter (Signed)
Outgoing call is made, left message for legal guardian, Lendon Colonel to call back. Please inform her of the following regarding scheduled surgery for Bridgett:  ? ?Pre-Admission date/time, COVID Testing date and Surgery date. ? ?Surgery Date: 09/03/21 ?Preadmission Testing Date: 09/02/21 (phone 1p-5p) ?Covid Testing Date: Not needed. ? ?Also they will need to call at [phone], between 1-3:00pm the day before surgery, to find out what time to arrive for surgery.   ? ?

## 2021-08-31 NOTE — Telephone Encounter (Signed)
Patients legal guardian Burr Medico) left vm requesting a call back from Dr Marius Ditch or her nurse in reference to the medication (mesalamine) that was prescribed today. Guardian has questions about the medication before the patient starts taking it.  ?

## 2021-09-01 NOTE — Progress Notes (Signed)
Per Dr Everlene Farrier Medical Clearance is not needed for the patient.  ?

## 2021-09-02 ENCOUNTER — Encounter
Admission: RE | Admit: 2021-09-02 | Discharge: 2021-09-02 | Disposition: A | Discharge status: Home / Self Care | Payer: Medicare Other | Source: Ambulatory Visit | Attending: Surgery | Admitting: Surgery

## 2021-09-02 VITALS — Ht 65.0 in | Wt 226.0 lb

## 2021-09-02 DIAGNOSIS — Z01812 Encounter for preprocedural laboratory examination: Secondary | ICD-10-CM

## 2021-09-02 HISTORY — DX: Noninfective gastroenteritis and colitis, unspecified: K52.9

## 2021-09-02 HISTORY — DX: Type 2 diabetes mellitus without complications: E11.9

## 2021-09-02 NOTE — Patient Instructions (Addendum)
Your procedure is scheduled on: Thursday, May 18 ?Report to the Registration Desk on the 1st floor of the [facility]. ?To find out your arrival time, please call [phone] between 1PM - 3PM on: Wednesday, May 17 ?If your arrival time is 6:00 am, do not arrive prior to that time as the [facility] entrance doors do not open until 6:00 am. ? ?REMEMBER: ?Instructions that are not followed completely may result in serious medical risk, up to and including death; or upon the discretion of your surgeon and anesthesiologist your surgery may need to be rescheduled. ? ?Do not eat food after midnight the night before surgery.  ?No gum chewing, lozengers or hard candies. ? ?You may however, drink water up to 2 hours before you are scheduled to arrive for your surgery. Do not drink anything within 2 hours of your scheduled arrival time. ? ?TAKE THESE MEDICATIONS THE MORNING OF SURGERY WITH A SIP OF WATER: ? ?Omeprazole-sodium bicarbonate (Zegerid) - (take one the night before and one on the morning of surgery - helps to prevent nausea after surgery.) ? ?Do not take any metformin or farxiga today or tomorrow. ? ?One week prior to surgery: ?Stop Anti-inflammatories (NSAIDS) such as Advil, Aleve, Ibuprofen, Motrin, Naproxen, Naprosyn and Aspirin based products such as Excedrin, Goodys Powder, BC Powder. ?Stop ANY OVER THE COUNTER supplements until after surgery. ?You may however, continue to take Tylenol if needed for pain up until the day of surgery. ? ?No Alcohol for 24 hours before or after surgery. ? ?No Smoking including e-cigarettes for 24 hours prior to surgery.  ?No chewable tobacco products for at least 6 hours prior to surgery.  ?No nicotine patches on the day of surgery. ? ?Do not use any "recreational" drugs for at least a week prior to your surgery.  ?Please be advised that the combination of cocaine and anesthesia may have negative outcomes, up to and including death. ?If you test positive for cocaine, your  surgery will be cancelled. ? ?On the morning of surgery brush your teeth with toothpaste and water, you may rinse your mouth with mouthwash if you wish. ?Do not swallow any toothpaste or mouthwash. ? ?Shower using antibacterial soap prior to arriving at the hospital. ? ?Do not wear jewelry, make-up, hairpins, clips or nail polish. ? ?Do not wear lotions, powders, or perfumes.  ? ?Do not shave body from the neck down 48 hours prior to surgery just in case you cut yourself which could leave a site for infection.  ? ?Contact lenses, hearing aids and dentures may not be worn into surgery. ? ?Do not bring valuables to the hospital. [facility] is not responsible for any missing/lost belongings or valuables.  ? ?Notify your doctor if there is any change in your medical condition (cold, fever, infection). ? ?Wear comfortable clothing (specific to your surgery type) to the hospital. ? ?After surgery, you can help prevent lung complications by doing breathing exercises.  ?Take deep breaths and cough every 1-2 hours. Your doctor may order a device called an Incentive Spirometer to help you take deep breaths. ?When coughing or sneezing, hold a pillow firmly against your incision with both hands. This is called ?splinting.? Doing this helps protect your incision. It also decreases belly discomfort. ? ?If you are being discharged the day of surgery, you will not be allowed to drive home. ?You will need a responsible adult (18 years or older) to drive you home and stay with you that night.  ? ?  If you are taking public transportation, you will need to have a responsible adult (18 years or older) with you. ?Please confirm with your physician that it is acceptable to use public transportation.  ? ?Please call the [facility] at [phone] if you have any questions about these instructions. ? ?Surgery Visitation Policy: ? ?Patients undergoing a surgery or procedure may have two family members or support persons  with them as long as the person is not COVID-19 positive or experiencing its symptoms.  ?

## 2021-09-03 ENCOUNTER — Ambulatory Visit
Admission: RE | Admit: 2021-09-03 | Discharge: 2021-09-03 | Disposition: A | Discharge status: Home / Self Care | Payer: Medicare Other | Attending: Surgery | Admitting: Surgery

## 2021-09-03 ENCOUNTER — Ambulatory Visit: Payer: Medicare Other | Admitting: Anesthesiology

## 2021-09-03 ENCOUNTER — Encounter: Payer: Self-pay | Admitting: Surgery

## 2021-09-03 ENCOUNTER — Encounter
Admission: RE | Disposition: A | Discharge status: Home / Self Care | Payer: Self-pay | Source: Home / Self Care | Attending: Surgery

## 2021-09-03 ENCOUNTER — Other Ambulatory Visit: Payer: Self-pay

## 2021-09-03 DIAGNOSIS — K828 Other specified diseases of gallbladder: Secondary | ICD-10-CM | POA: Insufficient documentation

## 2021-09-03 DIAGNOSIS — K219 Gastro-esophageal reflux disease without esophagitis: Secondary | ICD-10-CM | POA: Insufficient documentation

## 2021-09-03 DIAGNOSIS — Z7984 Long term (current) use of oral hypoglycemic drugs: Secondary | ICD-10-CM | POA: Diagnosis not present

## 2021-09-03 DIAGNOSIS — E118 Type 2 diabetes mellitus with unspecified complications: Secondary | ICD-10-CM | POA: Insufficient documentation

## 2021-09-03 DIAGNOSIS — K8010 Calculus of gallbladder with chronic cholecystitis without obstruction: Secondary | ICD-10-CM | POA: Diagnosis present

## 2021-09-03 DIAGNOSIS — E669 Obesity, unspecified: Secondary | ICD-10-CM | POA: Insufficient documentation

## 2021-09-03 DIAGNOSIS — Z6837 Body mass index (BMI) 37.0-37.9, adult: Secondary | ICD-10-CM | POA: Diagnosis not present

## 2021-09-03 DIAGNOSIS — K8020 Calculus of gallbladder without cholecystitis without obstruction: Secondary | ICD-10-CM

## 2021-09-03 DIAGNOSIS — Z01812 Encounter for preprocedural laboratory examination: Secondary | ICD-10-CM

## 2021-09-03 DIAGNOSIS — I10 Essential (primary) hypertension: Secondary | ICD-10-CM | POA: Diagnosis not present

## 2021-09-03 HISTORY — PX: CHOLECYSTECTOMY: SHX55

## 2021-09-03 LAB — POCT PREGNANCY, URINE: Preg Test, Ur: NEGATIVE

## 2021-09-03 LAB — GLUCOSE, CAPILLARY
Glucose-Capillary: 137 mg/dL — ABNORMAL HIGH (ref 70–99)
Glucose-Capillary: 156 mg/dL — ABNORMAL HIGH (ref 70–99)

## 2021-09-03 SURGERY — CHOLECYSTECTOMY, ROBOT-ASSISTED, LAPAROSCOPIC
Anesthesia: General

## 2021-09-03 MED ORDER — OXYCODONE-ACETAMINOPHEN 5-325 MG PO TABS
ORAL_TABLET | ORAL | Status: AC
  Filled 2021-09-03: qty 1

## 2021-09-03 MED ORDER — PHENYLEPHRINE HCL-NACL 20-0.9 MG/250ML-% IV SOLN
INTRAVENOUS | Status: AC
  Filled 2021-09-03: qty 250

## 2021-09-03 MED ORDER — BUPIVACAINE LIPOSOME 1.3 % IJ SUSP: INTRAMUSCULAR | Status: DC | PRN

## 2021-09-03 MED ORDER — FENTANYL CITRATE (PF) 100 MCG/2ML IJ SOLN
INTRAMUSCULAR | Status: AC
  Filled 2021-09-03: qty 2

## 2021-09-03 MED ORDER — BUPIVACAINE LIPOSOME 1.3 % IJ SUSP
INTRAMUSCULAR | Status: AC
  Filled 2021-09-03: qty 20

## 2021-09-03 MED ORDER — ROCURONIUM BROMIDE 100 MG/10ML IV SOLN
INTRAVENOUS | Status: DC | PRN
  Administered 2021-09-03: 50 mg via INTRAVENOUS
  Administered 2021-09-03: 10 mg via INTRAVENOUS

## 2021-09-03 MED ORDER — DIPHENHYDRAMINE HCL 50 MG/ML IJ SOLN
INTRAMUSCULAR | Status: DC | PRN
  Administered 2021-09-03: 12.5 mg via INTRAVENOUS

## 2021-09-03 MED ORDER — CHLORHEXIDINE GLUCONATE CLOTH 2 % EX PADS
6.0000 | MEDICATED_PAD | Freq: Once | CUTANEOUS | Status: AC
  Administered 2021-09-03: 6 via TOPICAL

## 2021-09-03 MED ORDER — DEXMEDETOMIDINE HCL IN NACL 200 MCG/50ML IV SOLN
INTRAVENOUS | Status: DC | PRN
  Administered 2021-09-03: 12 ug via INTRAVENOUS

## 2021-09-03 MED ORDER — CEFAZOLIN SODIUM-DEXTROSE 2-4 GM/100ML-% IV SOLN
INTRAVENOUS | Status: AC
  Filled 2021-09-03: qty 100

## 2021-09-03 MED ORDER — BUPIVACAINE-EPINEPHRINE (PF) 0.25% -1:200000 IJ SOLN
INTRAMUSCULAR | Status: DC | PRN
  Administered 2021-09-03: 50 mL via INTRAMUSCULAR

## 2021-09-03 MED ORDER — CEFAZOLIN SODIUM-DEXTROSE 2-4 GM/100ML-% IV SOLN
2.0000 g | INTRAVENOUS | Status: AC
  Administered 2021-09-03: 2 g via INTRAVENOUS

## 2021-09-03 MED ORDER — GABAPENTIN 300 MG PO CAPS
ORAL_CAPSULE | ORAL | Status: AC
  Filled 2021-09-03: qty 1

## 2021-09-03 MED ORDER — ACETAMINOPHEN 500 MG PO TABS
1000.0000 mg | ORAL_TABLET | ORAL | Status: AC
  Administered 2021-09-03: 1000 mg via ORAL

## 2021-09-03 MED ORDER — PROMETHAZINE HCL 25 MG/ML IJ SOLN: 6.2500 mg | INTRAMUSCULAR | Status: DC | PRN

## 2021-09-03 MED ORDER — CELECOXIB 200 MG PO CAPS
ORAL_CAPSULE | ORAL | Status: AC
  Filled 2021-09-03: qty 1

## 2021-09-03 MED ORDER — MIDAZOLAM HCL 2 MG/2ML IJ SOLN
INTRAMUSCULAR | Status: DC | PRN
  Administered 2021-09-03: 1 mg via INTRAVENOUS

## 2021-09-03 MED ORDER — CHLORHEXIDINE GLUCONATE CLOTH 2 % EX PADS: 6.0000 | MEDICATED_PAD | Freq: Once | CUTANEOUS | Status: DC

## 2021-09-03 MED ORDER — OXYCODONE-ACETAMINOPHEN 5-325 MG PO TABS
1.0000 | ORAL_TABLET | Freq: Once | ORAL | Status: AC
  Administered 2021-09-03: 1 via ORAL

## 2021-09-03 MED ORDER — PHENYLEPHRINE HCL (PRESSORS) 10 MG/ML IV SOLN
INTRAVENOUS | Status: DC | PRN
  Administered 2021-09-03: 240 ug via INTRAVENOUS
  Administered 2021-09-03: 160 ug via INTRAVENOUS
  Administered 2021-09-03: 80 ug via INTRAVENOUS
  Administered 2021-09-03 (×2): 160 ug via INTRAVENOUS

## 2021-09-03 MED ORDER — LIDOCAINE HCL (CARDIAC) PF 100 MG/5ML IV SOSY
PREFILLED_SYRINGE | INTRAVENOUS | Status: DC | PRN
Start: 2021-09-03 — End: 2021-09-03
  Administered 2021-09-03: 100 mg via INTRAVENOUS

## 2021-09-03 MED ORDER — DIPHENHYDRAMINE HCL 50 MG/ML IJ SOLN
INTRAMUSCULAR | Status: AC
  Filled 2021-09-03: qty 1

## 2021-09-03 MED ORDER — FENTANYL CITRATE (PF) 100 MCG/2ML IJ SOLN
INTRAMUSCULAR | Status: DC | PRN
  Administered 2021-09-03 (×2): 50 ug via INTRAVENOUS

## 2021-09-03 MED ORDER — BUPIVACAINE-EPINEPHRINE (PF) 0.25% -1:200000 IJ SOLN
INTRAMUSCULAR | Status: DC | PRN
Start: 2021-09-03 — End: 2021-09-03

## 2021-09-03 MED ORDER — PROPOFOL 10 MG/ML IV BOLUS
INTRAVENOUS | Status: DC | PRN
Start: 2021-09-03 — End: 2021-09-03
  Administered 2021-09-03: 140 mg via INTRAVENOUS

## 2021-09-03 MED ORDER — SUGAMMADEX SODIUM 200 MG/2ML IV SOLN
INTRAVENOUS | Status: DC | PRN
  Administered 2021-09-03: 200 mg via INTRAVENOUS

## 2021-09-03 MED ORDER — MIDAZOLAM HCL 2 MG/2ML IJ SOLN
INTRAMUSCULAR | Status: AC
  Filled 2021-09-03: qty 2

## 2021-09-03 MED ORDER — ACETAMINOPHEN 500 MG PO TABS
ORAL_TABLET | ORAL | Status: AC
  Filled 2021-09-03: qty 2

## 2021-09-03 MED ORDER — CHLORHEXIDINE GLUCONATE 0.12 % MT SOLN
OROMUCOSAL | Status: AC
  Filled 2021-09-03: qty 15

## 2021-09-03 MED ORDER — ONDANSETRON HCL 4 MG/2ML IJ SOLN
INTRAMUSCULAR | Status: DC | PRN
  Administered 2021-09-03: 4 mg via INTRAVENOUS

## 2021-09-03 MED ORDER — CELECOXIB 200 MG PO CAPS
200.0000 mg | ORAL_CAPSULE | ORAL | Status: AC
  Administered 2021-09-03: 200 mg via ORAL

## 2021-09-03 MED ORDER — SODIUM CHLORIDE 0.9 % IV SOLN: INTRAVENOUS | Status: DC

## 2021-09-03 MED ORDER — BUPIVACAINE-EPINEPHRINE (PF) 0.25% -1:200000 IJ SOLN
INTRAMUSCULAR | Status: AC
  Filled 2021-09-03: qty 30

## 2021-09-03 MED ORDER — DEXAMETHASONE SODIUM PHOSPHATE 10 MG/ML IJ SOLN
INTRAMUSCULAR | Status: DC | PRN
  Administered 2021-09-03: 5 mg via INTRAVENOUS

## 2021-09-03 MED ORDER — ACETAMINOPHEN 10 MG/ML IV SOLN: 1000.0000 mg | Freq: Once | INTRAVENOUS | Status: DC | PRN

## 2021-09-03 MED ORDER — EPHEDRINE SULFATE (PRESSORS) 50 MG/ML IJ SOLN
INTRAMUSCULAR | Status: DC | PRN
Start: 2021-09-03 — End: 2021-09-03
  Administered 2021-09-03: 15 mg via INTRAVENOUS
  Administered 2021-09-03 (×2): 5 mg via INTRAVENOUS

## 2021-09-03 MED ORDER — DROPERIDOL 2.5 MG/ML IJ SOLN: 0.6250 mg | Freq: Once | INTRAMUSCULAR | Status: DC | PRN

## 2021-09-03 MED ORDER — INDOCYANINE GREEN 25 MG IV SOLR
5.0000 mg | Freq: Once | INTRAVENOUS | Status: AC
  Administered 2021-09-03: 5 mg via INTRAVENOUS
  Filled 2021-09-03: qty 2

## 2021-09-03 MED ORDER — CHLORHEXIDINE GLUCONATE 0.12 % MT SOLN
15.0000 mL | Freq: Once | OROMUCOSAL | Status: AC
  Administered 2021-09-03: 15 mL via OROMUCOSAL

## 2021-09-03 MED ORDER — GABAPENTIN 300 MG PO CAPS
300.0000 mg | ORAL_CAPSULE | ORAL | Status: AC
  Administered 2021-09-03: 300 mg via ORAL

## 2021-09-03 MED ORDER — ORAL CARE MOUTH RINSE: 15.0000 mL | Freq: Once | OROMUCOSAL | Status: AC

## 2021-09-03 MED ORDER — HYDROMORPHONE HCL 1 MG/ML IJ SOLN: 0.2500 mg | INTRAMUSCULAR | Status: DC | PRN

## 2021-09-03 MED ORDER — OXYCODONE-ACETAMINOPHEN 5-325 MG PO TABS: 1.0000 | ORAL_TABLET | ORAL | 0 refills | Status: DC | PRN

## 2021-09-03 SURGICAL SUPPLY — 51 items
"PENCIL ELECTRO HAND CTR " (MISCELLANEOUS) ×1 IMPLANT
BAG RETRIEVAL 10 (BASKET) ×1
CANNULA REDUC XI 12-8 STAPL (CANNULA) ×2
CANNULA REDUCER 12-8 DVNC XI (CANNULA) ×1 IMPLANT
CATH REDDICK CHOLANGI 4FR 50CM (CATHETERS) IMPLANT
CLIP LIGATING HEMO O LOK GREEN (MISCELLANEOUS) ×2 IMPLANT
DERMABOND ADVANCED (GAUZE/BANDAGES/DRESSINGS) ×1
DERMABOND ADVANCED .7 DNX12 (GAUZE/BANDAGES/DRESSINGS) ×1 IMPLANT
DRAPE ARM DVNC X/XI (DISPOSABLE) ×4 IMPLANT
DRAPE COLUMN DVNC XI (DISPOSABLE) ×1 IMPLANT
DRAPE DA VINCI XI ARM (DISPOSABLE) ×8
DRAPE DA VINCI XI COLUMN (DISPOSABLE) ×2
ELECT CAUTERY BLADE 6.4 (BLADE) ×2 IMPLANT
ELECT REM PT RETURN 9FT ADLT (ELECTROSURGICAL) ×2
ELECTRODE REM PT RTRN 9FT ADLT (ELECTROSURGICAL) ×1 IMPLANT
GLOVE BIO SURGEON STRL SZ7 (GLOVE) ×10 IMPLANT
GOWN STRL REUS W/ TWL LRG LVL3 (GOWN DISPOSABLE) ×4 IMPLANT
GOWN STRL REUS W/TWL LRG LVL3 (GOWN DISPOSABLE) ×8
IRRIGATION STRYKERFLOW (MISCELLANEOUS) IMPLANT
IRRIGATOR STRYKERFLOW (MISCELLANEOUS)
IV CATH ANGIO 12GX3 LT BLUE (NEEDLE) IMPLANT
KIT PINK PAD W/HEAD ARE REST (MISCELLANEOUS) ×2
KIT PINK PAD W/HEAD ARM REST (MISCELLANEOUS) ×1 IMPLANT
LABEL OR SOLS (LABEL) ×2 IMPLANT
MANIFOLD NEPTUNE II (INSTRUMENTS) ×2 IMPLANT
NEEDLE HYPO 22GX1.5 SAFETY (NEEDLE) ×2 IMPLANT
NS IRRIG 500ML POUR BTL (IV SOLUTION) ×2 IMPLANT
OBTURATOR OPTICAL STANDARD 8MM (TROCAR) ×2
OBTURATOR OPTICAL STND 8 DVNC (TROCAR) ×1
OBTURATOR OPTICALSTD 8 DVNC (TROCAR) ×1 IMPLANT
PACK LAP CHOLECYSTECTOMY (MISCELLANEOUS) ×2 IMPLANT
PENCIL ELECTRO HAND CTR (MISCELLANEOUS) ×2 IMPLANT
SEAL CANN UNIV 5-8 DVNC XI (MISCELLANEOUS) ×3 IMPLANT
SEAL XI 5MM-8MM UNIVERSAL (MISCELLANEOUS) ×6
SET TUBE SMOKE EVAC HIGH FLOW (TUBING) ×2 IMPLANT
SOLUTION ELECTROLUBE (MISCELLANEOUS) ×2 IMPLANT
SPIKE FLUID TRANSFER (MISCELLANEOUS) ×2 IMPLANT
SPONGE T-LAP 18X18 ~~LOC~~+RFID (SPONGE) ×2 IMPLANT
SPONGE T-LAP 4X18 ~~LOC~~+RFID (SPONGE) IMPLANT
STAPLER CANNULA SEAL DVNC XI (STAPLE) ×1 IMPLANT
STAPLER CANNULA SEAL XI (STAPLE) ×2
STOPCOCK 3 WAY MALE LL (IV SETS)
STOPCOCK 3WAY MALE LL (IV SETS) IMPLANT
SUT MNCRL AB 4-0 PS2 18 (SUTURE) ×2 IMPLANT
SUT VICRYL 0 AB UR-6 (SUTURE) ×4 IMPLANT
SYR 20ML LL LF (SYRINGE) ×2 IMPLANT
SYR 30ML LL (SYRINGE) ×2 IMPLANT
SYS BAG RETRIEVAL 10MM (BASKET) ×1
SYSTEM BAG RETRIEVAL 10MM (BASKET) ×1 IMPLANT
WATER STERILE IRR 3000ML UROMA (IV SOLUTION) IMPLANT
WATER STERILE IRR 500ML POUR (IV SOLUTION) ×2 IMPLANT

## 2021-09-03 NOTE — Discharge Instructions (Addendum)
Laparoscopic Cholecystectomy, Care After   These instructions give you information on caring for yourself after your procedure. Your doctor may also give you more specific instructions. Call your doctor if you have any problems or questions after your procedure.  HOME CARE  Change your bandages (dressings) as told by your doctor.  Keep the wound dry and clean. Wash the wound gently with soap and water. Pat the wound dry with a clean towel.  Do not take baths, swim, or use hot tubs for 2 weeks, or as told by your doctor.  Only take medicine as told by your doctor.  Eat a normal diet as told by your doctor.  Do not lift anything heavier than 10 pounds (4.5 kg) until your doctor says it is okay.  Do not play contact sports for 1 week, or as told by your doctor. GET HELP IF:  Your wound is red, puffy (swollen), or painful.  You have yellowish-white fluid (pus) coming from the wound.  You have fluid draining from the wound for more than 1 day.  You have a bad smell coming from the wound.  Your wound breaks open. GET HELP RIGHT AWAY IF:  You have trouble breathing.  You have chest pain.  You have a fever >101  You have pain in the shoulders (shoulder strap areas) that is getting worse.  You feel dizzy or pass out (faint).  You have severe belly (abdominal) pain.  You feel sick to your stomach (nauseous) or throw up (vomit) for more than 1 day.  DO NOT REMOVE TEAL GREEN BAND FOR 4 DAYS  AMBULATORY SURGERY  DISCHARGE INSTRUCTIONS   The drugs that you were given will stay in your system until tomorrow so for the next 24 hours you should not:  Drive an automobile Make any legal decisions Drink any alcoholic beverage   You may resume regular meals tomorrow.  Today it is better to start with liquids and gradually work up to solid foods.  You may eat anything you prefer, but it is better to start with liquids, then soup and crackers, and gradually work up to solid foods.   Please  notify your doctor immediately if you have any unusual bleeding, trouble breathing, redness and pain at the surgery site, drainage, fever, or pain not relieved by medication.    Additional Instructions:   Please contact your physician with any problems or Same Day Surgery at [phone], Monday through Friday 6 am to 4 pm, or [facility] number at [phone].

## 2021-09-03 NOTE — Anesthesia Procedure Notes (Addendum)
Procedure Name: Intubation Date/Time: 09/03/2021 11:38 AM Performed by: Gilford Raid, CRNA Pre-anesthesia Checklist: Patient identified, Emergency Drugs available, Suction available and Patient being monitored Patient Re-evaluated:Patient Re-evaluated prior to induction Oxygen Delivery Method: Circle system utilized Preoxygenation: Pre-oxygenation with 100% oxygen Induction Type: IV induction Ventilation: Mask ventilation without difficulty Laryngoscope Size: Miller and 2 Grade View: Grade I Tube type: Oral Tube size: 7.0 mm Number of attempts: 1 Airway Equipment and Method: Stylet Placement Confirmation: ETT inserted through vocal cords under direct vision, positive ETCO2 and breath sounds checked- equal and bilateral Secured at: 21 cm Tube secured with: Tape Dental Injury: Teeth and Oropharynx as per pre-operative assessment

## 2021-09-03 NOTE — Anesthesia Preprocedure Evaluation (Addendum)
Anesthesia Evaluation  Patient identified by MRN, date of birth, ID band Patient awake  General Assessment Comment:Alert, needs direction from caretaker  Reviewed: Allergy & Precautions, NPO status , Patient's Chart, lab work & pertinent test results, reviewed documented beta blocker date and time   Airway Mallampati: III  TM Distance: >3 FB Neck ROM: full    Dental  (+) Missing,    Pulmonary neg pulmonary ROS,    Pulmonary exam normal        Cardiovascular Exercise Tolerance: Good hypertension, Pt. on medications and Pt. on home beta blockers Normal cardiovascular exam     Neuro/Psych mild cognitive impairmentnegative neurological ROS     GI/Hepatic Neg liver ROS, GERD  Controlled and Medicated,cholecystitis   Endo/Other  diabetes, Well Controlled, Type 2, Oral Hypoglycemic Agents  Renal/GU negative Renal ROS  negative genitourinary   Musculoskeletal   Abdominal (+) + obese,   Peds  Hematology negative hematology ROS (+)   Anesthesia Other Findings Past Medical History: No date: Hypertension No date: Mental deficiency No date: Obese 05/31/2017: Right upper quadrant pain  Past Surgical History: No date: ABDOMINAL SURGERY; Right     Comment:  unknown to date of surgery     Reproductive/Obstetrics negative OB ROS                           Anesthesia Physical  Anesthesia Plan  ASA: 2  Anesthesia Plan: General   Post-op Pain Management: Gabapentin PO (pre-op)*, Celebrex PO (pre-op)* and Tylenol PO (pre-op)*   Induction: Intravenous  PONV Risk Score and Plan: 4 or greater and Ondansetron, Dexamethasone, Midazolam and Promethazine  Airway Management Planned: Oral ETT  Additional Equipment:   Intra-op Plan:   Post-operative Plan:   Informed Consent: I have reviewed the patients History and Physical, chart, labs and discussed the procedure including the risks, benefits and  alternatives for the proposed anesthesia with the patient or authorized representative who has indicated his/her understanding and acceptance.     Dental advisory given and Consent reviewed with POA  Plan Discussed with: Anesthesiologist, CRNA and Surgeon  Anesthesia Plan Comments:        Anesthesia Quick Evaluation

## 2021-09-03 NOTE — Transfer of Care (Signed)
Immediate Anesthesia Transfer of Care Note  Patient: Susan Garrison  Procedure(s) Performed: XI ROBOTIC ASSISTED LAPAROSCOPIC CHOLECYSTECTOMY INDOCYANINE GREEN FLUORESCENCE IMAGING (ICG)  Patient Location: PACU  Anesthesia Type:General  Level of Consciousness: awake, alert  and oriented  Airway & Oxygen Therapy: Patient Spontanous Breathing and Patient connected to face mask oxygen  Post-op Assessment: Report given to RN and Post -op Vital signs reviewed and stable  Post vital signs: Reviewed and stable  Last Vitals:  Vitals Value Taken Time  BP 120/83 09/03/21 1252  Temp    Pulse 81 09/03/21 1256  Resp 26 09/03/21 1256  SpO2 100 % 09/03/21 1256  Vitals shown include unvalidated device data.  Last Pain:  Vitals:   09/03/21 1029  PainSc: 0-No pain         Complications: No notable events documented.

## 2021-09-03 NOTE — Op Note (Signed)
Robotic assisted laparoscopic Cholecystectomy  Pre-operative Diagnosis: chronic cholecystitis  Post-operative Diagnosis: same  Procedure:  Robotic assisted laparoscopic Cholecystectomy  Surgeon: Sterling Big, MD FACS  Anesthesia: Gen. with endotracheal tube  Findings: Chronic mild Cholecystitis  Cholelithiasis Contracted and very small Gallbladder Hepatic steatosis ( fatty liver)  Estimated Blood Loss: 10cc       Specimens: Gallbladder           Complications: none   Procedure Details  The patient was seen again in the Holding Room. The benefits, complications, treatment options, and expected outcomes were discussed with the patient. The risks of bleeding, infection, recurrence of symptoms, failure to resolve symptoms, bile duct damage, bile duct leak, retained common bile duct stone, bowel injury, any of which could require further surgery and/or ERCP, stent, or papillotomy were reviewed with the patient. The likelihood of improving the patient's symptoms with return to their baseline status is good.  The patient and/or family concurred with the proposed plan, giving informed consent.  The patient was taken to Operating Room, identified  and the procedure verified as Laparoscopic Cholecystectomy.  A Time Out was held and the above information confirmed.  Prior to the induction of general anesthesia, antibiotic prophylaxis was administered. VTE prophylaxis was in place. General endotracheal anesthesia was then administered and tolerated well. After the induction, the abdomen was prepped with Chloraprep and draped in the sterile fashion. The patient was positioned in the supine position.  Cut down technique was used to enter the abdominal cavity and a Hasson trochar was placed after two vicryl stitches were anchored to the fascia. Pneumoperitoneum was then created with CO2 and tolerated well without any adverse changes in the patient's vital signs.  Three 8-mm ports were placed under  direct vision. All skin incisions  were infiltrated with a local anesthetic agent before making the incision and placing the trocars.   The patient was positioned  in reverse Trendelenburg, robot was brought to the surgical field and docked in the standard fashion.  We made sure all the instrumentation was kept indirect view at all times and that there were no collision between the arms. I scrubbed out and went to the console.  The gallbladder was identified, the fundus grasped and retracted cephalad. Adhesions were lysed bluntly. The infundibulum was grasped and retracted laterally, exposing the peritoneum overlying the triangle of Calot. This was then divided and exposed in a blunt fashion. An extended critical view of the cystic duct and cystic artery was obtained.  The cystic duct was clearly identified and bluntly dissected.   Artery and duct were double clipped and divided. Using ICG cholangiography we visualize the cystic duct and CBD no evidence of bile injuries observed. The gallbladder was taken from the gallbladder fossa in a retrograde fashion with the electrocautery.  Hemostasis was achieved with the electrocautery. nspection of the right upper quadrant was performed. No bleeding, bile duct injury or leak, or bowel injury was noted. Robotic instruments and robotic arms were undocked in the standard fashion.  I scrubbed back in.  The gallbladder was removed and placed in an Endocatch bag.   Pneumoperitoneum was released.  The periumbilical port site was closed with interrumpted 0 Vicryl sutures. 4-0 subcuticular Monocryl was used to close the skin. Dermabond was  applied.  The patient was then extubated and brought to the recovery room in stable condition. Sponge, lap, and needle counts were correct at closure and at the conclusion of the case.  Caroleen Hamman, MD, FACS

## 2021-09-03 NOTE — Interval H&P Note (Signed)
History and Physical Interval Note:  09/03/2021 11:01 AM  Susan Garrison  has presented today for surgery, with the diagnosis of chronic cholecystitis.  The various methods of treatment have been discussed with the patient and family. After consideration of risks, benefits and other options for treatment, the patient has consented to  Procedure(s): XI ROBOTIC ASSISTED LAPAROSCOPIC CHOLECYSTECTOMY (N/A) INDOCYANINE GREEN FLUORESCENCE IMAGING (ICG) (N/A) as a surgical intervention.  The patient's history has been reviewed, patient examined, no change in status, stable for surgery.  I have reviewed the patient's chart and labs.  Questions were answered to the patient's satisfaction.     Linell Shawn F Lawan Nanez

## 2021-09-04 NOTE — Anesthesia Postprocedure Evaluation (Signed)
Anesthesia Post Note  Patient: Susan Garrison  Procedure(s) Performed: XI ROBOTIC ASSISTED LAPAROSCOPIC CHOLECYSTECTOMY INDOCYANINE GREEN FLUORESCENCE IMAGING (ICG)  Patient location during evaluation: PACU Anesthesia Type: General Level of consciousness: awake and alert Pain management: pain level controlled Vital Signs Assessment: post-procedure vital signs reviewed and stable Respiratory status: spontaneous breathing, nonlabored ventilation and respiratory function stable Cardiovascular status: blood pressure returned to baseline and stable Postop Assessment: no apparent nausea or vomiting Anesthetic complications: no   No notable events documented.   Last Vitals:  Vitals:   09/03/21 1330 09/03/21 1344  BP: 126/62 132/83  Pulse: 75 64  Resp: 19 18  Temp:  (!) 36.1 C  SpO2: 100% 100%    Last Pain:  Vitals:   09/03/21 1344  TempSrc: Temporal  PainSc: 0-No pain                 Foye Deer

## 2021-09-07 LAB — SURGICAL PATHOLOGY

## 2021-09-15 ENCOUNTER — Ambulatory Visit (INDEPENDENT_AMBULATORY_CARE_PROVIDER_SITE_OTHER): Payer: Medicare Other | Admitting: Physician Assistant

## 2021-09-15 ENCOUNTER — Encounter: Payer: Self-pay | Admitting: Physician Assistant

## 2021-09-15 DIAGNOSIS — Z09 Encounter for follow-up examination after completed treatment for conditions other than malignant neoplasm: Secondary | ICD-10-CM

## 2021-09-15 DIAGNOSIS — K8020 Calculus of gallbladder without cholecystitis without obstruction: Secondary | ICD-10-CM

## 2021-09-15 NOTE — Progress Notes (Signed)
[facility] POST-OP OFFICE VISIT  09/15/2021  HPI: Susan Garrison is a 46 y.o. female 12 days s/p robotic assisted laparoscopic cholecystectomy for chronic cholecystitis with Dr Dahlia Byes  She is doing very well No abdominal pain, nausea, emesis, or bowel changes Some itching at incisions but not erythema or drainage No issues with PO intake Anxious to get back to her normal exercises No other complaints   Physical Exam: Constitutional: Well appearing female, NAD Abdomen: Soft, non-tender, non-distended, no rebound/guarding Skin: Laparoscopic incisions are healing well, some scabbing mainly on umbilical incision, no erythema or drainage   Assessment/Plan: This is a 46 y.o. female 12 days s/p robotic assisted laparoscopic cholecystectomy for chronic cholecystitis   - Pain control prn  - Reviewed wound care recommendation  - Reviewed lifting restrictions; 4 weeks total  - Reviewed surgical pathology; [place]; negative for malignancy  - She can follow up on as needed basis; She understands to call with questions/concerns  -- Edison Simon, PA-C [facility] 09/15/2021, 1:51 PM M-F: 7am - 4pm

## 2021-09-15 NOTE — Progress Notes (Signed)
Clinically unable to determine, I did not do this note and was not on my h/p, You may have to contact anesthesia provider. I am not her PCP either

## 2021-09-16 ENCOUNTER — Telehealth: Payer: Self-pay | Admitting: Gastroenterology

## 2021-09-16 NOTE — Telephone Encounter (Signed)
PT started taken Mesalamine this morning and states she is having trouble swallowing the medication she would like some advice on what she should do.

## 2021-09-16 NOTE — Telephone Encounter (Signed)
If she is doing tablets, she can put it in applesauce and then swallow  RV

## 2021-09-17 NOTE — Telephone Encounter (Signed)
Patient mom verbalized of instructions

## 2021-09-29 ENCOUNTER — Ambulatory Visit (INDEPENDENT_AMBULATORY_CARE_PROVIDER_SITE_OTHER): Payer: Medicare Other | Admitting: Physician Assistant

## 2021-09-29 ENCOUNTER — Encounter: Payer: Self-pay | Admitting: Physician Assistant

## 2021-09-29 VITALS — BP 94/59 | HR 83 | Temp 98.4°F | Wt 220.8 lb

## 2021-09-29 DIAGNOSIS — K8020 Calculus of gallbladder without cholecystitis without obstruction: Secondary | ICD-10-CM

## 2021-09-29 DIAGNOSIS — Z09 Encounter for follow-up examination after completed treatment for conditions other than malignant neoplasm: Secondary | ICD-10-CM

## 2021-09-29 NOTE — Patient Instructions (Signed)
If you have any concerns or questions, please feel free to call our office. See follow up appointment below.   Wound Care, Adult Taking care of your wound properly can help to prevent pain, infection, and scarring. It can also help your wound heal more quickly. Follow instructions from your health care provider about how to care for your wound. Supplies needed: Soap and water. Wound cleanser, saline, or germ-free (sterile) water. Gauze. If needed, a clean bandage (dressing) or other type of wound dressing material to cover or place in the wound. Follow your health care provider's instructions about what dressing supplies to use. Cream or topical ointment to apply to the wound, if told by your health care provider. How to care for your wound Cleaning the wound Ask your health care provider how to clean the wound. This may include: Using mild soap and water, a wound cleanser, saline, or sterile water. Using a clean gauze to pat the wound dry after cleaning it. Do not rub or scrub the wound. Dressing care Wash your hands with soap and water for at least 20 seconds before and after you change the dressing. If soap and water are not available, use hand sanitizer. Change your dressing as told by your health care provider. This may include: Cleaning or rinsing out (irrigating) the wound. Application of cream or topical ointment, if told by your health care provider. Placing a dressing over the wound or in the wound (packing). Covering the wound with an outer dressing. Leave stitches (sutures), staples, skin glue, or adhesive strips in place. These skin closures may need to stay in place for 2 weeks or longer. If adhesive strip edges start to loosen and curl up, you may trim the loose edges. Do not remove adhesive strips completely unless your health care provider tells you to do that. Ask your health care provider when you can leave the wound uncovered. Checking for infection Check your wound area  every day for signs of infection. Check for: More redness, swelling, or pain. Fluid or blood. Warmth. Pus or a bad smell.  Follow these instructions at home Medicines If you were prescribed an antibiotic medicine, cream, or ointment, take or apply it as told by your health care provider. Do not stop using the antibiotic even if your condition improves. If you were prescribed pain medicine, take it 30 minutes before you do any wound care or as told by your health care provider. Take over-the-counter and prescription medicines only as told by your health care provider. Eating and drinking Eat a diet that includes protein, vitamin A, vitamin C, and other nutrient-rich foods to help the wound heal. Foods rich in protein include meat, fish, eggs, dairy, beans, and nuts. Foods rich in vitamin A include carrots and dark green, leafy vegetables. Foods rich in vitamin C include citrus fruits, tomatoes, broccoli, and peppers. Drink enough fluid to keep your urine pale yellow. General instructions Do not take baths, swim, or use a hot tub until your health care provider approves. Ask your health care provider if you may take showers. You may only be allowed to take sponge baths. Do not scratch or pick at the wound. Keep it covered as told by your health care provider. Return to your normal activities as told by your health care provider. Ask your health care provider what activities are safe for you. Protect your wound from the sun when you are outside for the first 6 months, or for as long as told by your   health care provider. Cover up the scar area or apply sunscreen that has an SPF of at least 30. Do not use any products that contain nicotine or tobacco. These products include cigarettes, chewing tobacco, and vaping devices, such as e-cigarettes. If you need help quitting, ask your health care provider. Keep all follow-up visits. This is important. Contact a health care provider if: You received a  tetanus shot and you have swelling, severe pain, redness, or bleeding at the injection site. Your pain is not controlled with medicine. You have any of these signs of infection: More redness, swelling, or pain around the wound. Fluid or blood coming from the wound. Warmth coming from the wound. A fever or chills. You are nauseous or you vomit. You are dizzy. You have a new rash or hardness around the wound. Get help right away if: You have a red streak of skin near the area around your wound. Pus or a bad smell coming from the wound. Your wound has been closed with staples, sutures, skin glue, or adhesive strips and it begins to open up and separate. Your wound is bleeding, and the bleeding does not stop with gentle pressure. These symptoms may represent a serious problem that is an emergency. Do not wait to see if the symptoms will go away. Get medical help right away. Call your local emergency services (911 in the [place] Do not drive yourself to the hospital. Summary Always wash your hands with soap and water for at least 20 seconds before and after changing your dressing. Change your dressing as told by your health care provider. To help with healing, eat foods that are rich in protein, vitamin A, vitamin C, and other nutrients. Check your wound every day for signs of infection. Contact your health care provider if you think that your wound is infected. This information is not intended to replace advice given to you by your health care provider. Make sure you discuss any questions you have with your health care provider. Document Revised: 08/12/2020 Document Reviewed: 08/12/2020 Elsevier Patient Education  2023 [employer]  

## 2021-09-29 NOTE — Progress Notes (Signed)
[facility] POST-OP OFFICE VISIT  09/29/2021  HPI: Susan Garrison is a 46 y.o. female 26 days s/p robotic assisted laparoscopic cholecystectomy for chronic cholecystitis with Dr Dahlia Byes  Unfortunately in the last few days, patient and her family report that her umbilical incision appears to have dehisced There is some scant drainage; nothing purulent No fever, chills Abdomen is otherwise non-tender, no nausea/emesis No other complaints   Vital signs: BP (!) 94/59   Pulse 83   Temp 98.4 F (36.9 C) (Oral)   Wt 220 lb 12.8 oz (100.2 kg)   LMP 08/05/2021 (Approximate)   SpO2 98%   BMI 36.74 kg/m    Physical Exam: Constitutional: Well appearing female, NAD Abdomen: Soft, non-tender, non-distended, no rebound/guarding Skin: Her umbilical incision has dehisced superficially, depth ~2 mm, wound bed is intact. There is fibrinous tissue which I cleaned. Her other laparoscopic incisions are all well healed.   Assessment/Plan: This is a 46 y.o. female 26 days s/p robotic assisted laparoscopic cholecystectomy for chronic cholecystitis    - Reviewed wound care recommendations; Keep wound covered as needed, okay to remove to shower. Replace after  - Reviewed lifting restrictions; She will complete these on Thursday  - I will see her again in 2 weeks for wound check  -- Edison Simon, PA-C  [facility] 09/29/2021, 3:12 PM M-F: 7am - 4pm

## 2021-10-13 ENCOUNTER — Encounter: Payer: Self-pay | Admitting: Physician Assistant

## 2021-10-13 ENCOUNTER — Ambulatory Visit (INDEPENDENT_AMBULATORY_CARE_PROVIDER_SITE_OTHER): Payer: Medicare Other | Admitting: Physician Assistant

## 2021-10-13 ENCOUNTER — Other Ambulatory Visit: Payer: Self-pay

## 2021-10-13 VITALS — BP 108/74 | HR 78 | Temp 98.0°F | Ht 64.0 in | Wt 226.4 lb

## 2021-10-13 DIAGNOSIS — K8010 Calculus of gallbladder with chronic cholecystitis without obstruction: Secondary | ICD-10-CM

## 2021-10-13 DIAGNOSIS — Z09 Encounter for follow-up examination after completed treatment for conditions other than malignant neoplasm: Secondary | ICD-10-CM

## 2021-10-13 NOTE — Progress Notes (Addendum)
[facility] POST-OP OFFICE VISIT  10/13/2021  HPI: Susan Garrison is a 46 y.o. female ~6 weeks s/p robotic assisted laparoscopic cholecystectomy for chronic cholecystitis with Dr Everlene Farrier unfortunately this has become complicated by umbilical wound superficial dehiscence now healing via secondary intention.   She is doing well Wound is slowly healing Drainage is slowing No fever, chills  Vital signs: BP 108/74   Pulse 78   Temp 98 F (36.7 C) (Oral)   Ht 5\' 4"  (1.626 m)   Wt 226 lb 6.4 oz (102.7 kg)   SpO2 97%   BMI 38.86 kg/m    Physical Exam: Constitutional: Well appearing female, NAD Abdomen: Soft, non-tender, non-distended, no rebound/guarding Skin: Umbilical incision now healing via secondary intention, this measures aprox. 3 x 1.5 x <1 cm, wound bed is >90% granulation tissue, no erythema or drainage   Assessment/Plan: This is a 46 y.o. female ~6 weeks s/p robotic assisted laparoscopic cholecystectomy for chronic cholecystitis with Dr Everlene Farrier unfortunately this has become complicated by umbilical wound superficial dehiscence now healing via secondary intention   - Continue local wound care daily; reviewed wound care instructions and recommendations  - I will see her again in ~3 weeks for wound check. Clal with questions/concerns in the interim.   -- Lynden Oxford, PA-C  [facility] 10/13/2021, 2:30 PM M-F: 7am - 4pm

## 2021-11-03 ENCOUNTER — Encounter: Payer: Self-pay | Admitting: Physician Assistant

## 2021-11-03 ENCOUNTER — Ambulatory Visit (INDEPENDENT_AMBULATORY_CARE_PROVIDER_SITE_OTHER): Payer: Medicare Other | Admitting: Physician Assistant

## 2021-11-03 VITALS — BP 124/85 | HR 80 | Temp 97.8°F | Wt 221.0 lb

## 2021-11-03 DIAGNOSIS — Z09 Encounter for follow-up examination after completed treatment for conditions other than malignant neoplasm: Secondary | ICD-10-CM

## 2021-11-03 DIAGNOSIS — K8020 Calculus of gallbladder without cholecystitis without obstruction: Secondary | ICD-10-CM

## 2021-11-03 NOTE — Progress Notes (Signed)
[facility] POST-OP OFFICE VISIT  11/03/2021  HPI: Susan Garrison is a 46 y.o. female ~9 weeks s/p robotic assisted laparoscopic cholecystectomy for chronic cholecystitis with Dr Everlene Farrier unfortunately this has become complicated by umbilical wound superficial dehiscence now healing via secondary intention.   She continues to do well Wound is healing Only doing superficial dressings; minimal drainage No fever  Vital signs: BP 124/85   Pulse 80   Temp 97.8 F (36.6 C) (Oral)   Wt 221 lb (100.2 kg)   SpO2 96%   BMI 37.93 kg/m    Physical Exam: Constitutional: Well appearing female, NAD Abdomen: Soft, non-tender, non-distended, no rebound/guarding Skin: Umbilical incision now healing via secondary intention, this measures aprox. 0.5 x 1 x 0 cm, wound bed is 1000% granulation tissue, no erythema or drainage   Assessment/Plan: This is a 46 y.o. female ~9 weeks s/p robotic assisted laparoscopic cholecystectomy for chronic cholecystitis with Dr Everlene Farrier unfortunately this has become complicated by umbilical wound superficial dehiscence now healing via secondary intention.    - Reviewed wound care recommendation; continue superficial dressings; okay to shower  - I will see her again in ~2 weeks for hopefully the final wound check. Clal with questions/concerns in the interim.   -- Lynden Oxford, PA-C [facility] 11/03/2021, 2:09 PM M-F: 7am - 4pm

## 2021-11-03 NOTE — Patient Instructions (Addendum)
If you have any concerns or questions, please feel free to call our office. See follow up appointment below.  ? ? ?Gallbladder Eating Plan ? ?High blood cholesterol, obesity, a sedentary lifestyle, an unhealthy diet, and diabetes are risk factors for developing gallstones. ?If you have a gallbladder condition, you may have trouble digesting fats and tolerating high fat intake. Eating a low-fat diet can help reduce your symptoms and may be helpful before and after having surgery to remove your gallbladder (cholecystectomy). Your health care provider may recommend that you work with a dietitian to help you reduce the amount of fat in your diet. ?What are tips for following this plan? ?General guidelines ?Limit your fat intake to less than 30% of your total daily calories. If you eat around 1,800 calories each day, this means eating less than 60 grams (g) of fat per day. ?Fat is an important part of a healthy diet. Eating a low-fat diet can make it hard to maintain a healthy body weight. Ask your dietitian how much fat, calories, and other nutrients you need each day. ?Eat small, frequent meals throughout the day instead of three large meals. ?Drink at least 8-10 cups (1.9-2.4 L) of fluid a day. Drink enough fluid to keep your urine pale yellow. ?If you drink alcohol: ?Limit how much you have to: ?0-1 drink a day for women who are not pregnant. ?0-2 drinks a day for men. ?Know how much alcohol is in a drink. In the [place] one drink equals one 12 oz bottle of beer (355 mL), one 5 oz glass of wine (148 mL), or one 1? oz glass of hard liquor (44 mL). ?Reading food labels ? ?Check nutrition facts on food labels for the amount of fat per serving. Choose foods with less than 3 grams of fat per serving. ?Shopping ?Choose nonfat and low-fat healthy foods. Look for the words "nonfat," "low-fat," or "fat-free." ?Avoid buying processed or prepackaged foods. ?Cooking ?Cook using low-fat methods, such as baking, broiling, grilling,  or boiling. ?Cook with small amounts of healthy fats, such as olive oil, grapeseed oil, canola oil, avocado oil, or sunflower oil. ?What foods are recommended? ? ?All fresh, frozen, or canned fruits and vegetables. ?Whole grains. ?Low-fat or nonfat (skim) milk and yogurt. ?Lean meat, skinless poultry, fish, eggs, and beans. ?Low-fat protein supplement powders or drinks. ?Spices and herbs. ?The items listed above may not be a complete list of foods and beverages you can eat and drink. Contact a dietitian for more information. ?What foods are not recommended? ?High-fat foods. These include baked goods, fast food, fatty cuts of meat, ice cream, french toast, sweet rolls, pizza, cheese bread, foods covered with butter, creamy sauces, or cheese. ?Fried foods. These include french fries, tempura, battered fish, breaded chicken, fried breads, and sweets. ?Foods that cause bloating and gas. ?The items listed above may not be a complete list of foods that you should avoid. Contact a dietitian for more information. ?Summary ?A low-fat diet can be helpful if you have a gallbladder condition, or before and after gallbladder surgery. ?Limit your fat intake to less than 30% of your total daily calories. This is about 60 g of fat if you eat 1,800 calories each day. ?Eat small, frequent meals throughout the day instead of three large meals. ?This information is not intended to replace advice given to you by your health care provider. Make sure you discuss any questions you have with your health care provider. ?Document Revised: 03/20/2021 Document Reviewed: 03/20/2021 ?  Elsevier Patient Education ? 2023 Elsevier Inc. ? ?

## 2021-11-17 ENCOUNTER — Ambulatory Visit (INDEPENDENT_AMBULATORY_CARE_PROVIDER_SITE_OTHER): Payer: Medicare Other | Admitting: Physician Assistant

## 2021-11-17 ENCOUNTER — Encounter: Payer: Self-pay | Admitting: Physician Assistant

## 2021-11-17 ENCOUNTER — Other Ambulatory Visit: Payer: Self-pay

## 2021-11-17 VITALS — BP 93/63 | HR 75 | Temp 98.7°F | Ht 64.0 in | Wt 221.8 lb

## 2021-11-17 DIAGNOSIS — Z09 Encounter for follow-up examination after completed treatment for conditions other than malignant neoplasm: Secondary | ICD-10-CM

## 2021-11-17 DIAGNOSIS — K8010 Calculus of gallbladder with chronic cholecystitis without obstruction: Secondary | ICD-10-CM

## 2021-11-17 NOTE — Patient Instructions (Signed)

## 2021-11-17 NOTE — Progress Notes (Signed)
[facility] POST-OP OFFICE VISIT  11/17/2021  HPI: Susan Garrison is a 46 y.o. female ~11 weeks s/p robotic assisted laparoscopic cholecystectomy for chronic cholecystitis with Dr Everlene Farrier unfortunately this has become complicated by umbilical wound superficial dehiscence now healing via secondary intention.   Doing well No issues Wound closed No fever, chills  Vital signs: BP 93/63   Pulse 75   Temp 98.7 F (37.1 C) (Oral)   Ht 5\' 4"  (1.626 m)   Wt 221 lb 12.8 oz (100.6 kg)   SpO2 97%   BMI 38.07 kg/m    Physical Exam: Constitutional: Well appearing female, NAD Abdomen: Soft, non-tender, non-distended, no rebound/guarding Skin: Umbilical wound now closed; no erythema, no drainage   Assessment/Plan: This is a 46 y.o. female ~11 weeks s/p robotic assisted laparoscopic cholecystectomy for chronic cholecystitis with Dr 57 unfortunately this has become complicated by umbilical wound superficial dehiscence now healing via secondary intention.    - Wound now healed; nothing further from surgical perspective. She can follow up on as needed basis; She understands to call with questions/concerns  -- Everlene Farrier, PA-C  [facility] 11/17/2021, 3:22 PM M-F: 7am - 4pm

## 2021-12-14 ENCOUNTER — Ambulatory Visit: Payer: Medicare Other | Admitting: Gastroenterology

## 2022-01-27 NOTE — Telephone Encounter (Signed)
ERROR

## 2022-02-09 ENCOUNTER — Ambulatory Visit: Payer: Medicare Other | Admitting: Gastroenterology

## 2022-05-17 ENCOUNTER — Telehealth: Payer: Self-pay

## 2022-05-17 NOTE — Telephone Encounter (Signed)
-----  Message from Lin Landsman, MD sent at 05/14/2022  2:30 PM EST ----- Regarding: Follow up appointment  This pt was sitting in the lobby with her mom since 2pm thinking that they have an appointment with me today. I coincidentally came to office and they were sitting here.   Can you call her mom on Monday   Thanks RV

## 2022-05-17 NOTE — Telephone Encounter (Signed)
Called patient grandma and informed patient grandma that appointment was 06/14/2022 at 2:45 she states they will be there and wrote the appointment down

## 2022-06-14 ENCOUNTER — Other Ambulatory Visit: Payer: Self-pay

## 2022-06-14 ENCOUNTER — Encounter: Payer: Self-pay | Admitting: Gastroenterology

## 2022-06-14 ENCOUNTER — Ambulatory Visit (INDEPENDENT_AMBULATORY_CARE_PROVIDER_SITE_OTHER): Payer: 59 | Admitting: Gastroenterology

## 2022-06-14 VITALS — BP 110/62 | HR 89 | Temp 97.8°F | Ht 64.0 in | Wt 223.2 lb

## 2022-06-14 DIAGNOSIS — K529 Noninfective gastroenteritis and colitis, unspecified: Secondary | ICD-10-CM

## 2022-06-14 DIAGNOSIS — K523 Indeterminate colitis: Secondary | ICD-10-CM

## 2022-06-14 NOTE — Progress Notes (Signed)
[place], MD [address]  [place]  [place],  [address]  Main: [phone]  Fax: [phone]    Gastroenterology Consultation  Referring Provider:     Leonard Downing, * Primary Care Physician:  Leonard Downing, MD Primary Gastroenterologist:  Dr. Cephas Darby Reason for Consultation: Indeterminate left-sided colitis        HPI:   Susan Garrison is a 47 y.o. female referred by Dr. Arelia Sneddon, Curt Jews, MD  for consultation & management of fecal occult positive test.  Patient has history of mild cognitive impairment, accompanied by her grandmother who is her caretaker.  Patient denies any GI symptoms.  No known history of anemia.  She denies any constipation, diarrhea.  She denies any rectal bleeding.  She had a surgery in her abdomen after her birth.  Follow-up visit 08/03/2021 Patient underwent colonoscopy for FOBT positive.  Found to have inflammation in the colon as well as aphthous ulcers in the terminal ileum concerning for Crohn's disease.  However, the biopsies revealed active ileitis and mild patchy active colitis and proctitis only.  There was no evidence of granulomas or background chronicity.  Her GI profile PCR was negative for infection.  Fecal calprotectin levels were elevated at 182.  Patient reports having right upper quadrant discomfort radiating to right lower back, worse after eating.  She does acknowledge eating fatty foods, red meat on a regular basis as well as carbonated beverages on a daily basis.  She has been gaining weight.  No evidence of cholelithiasis but she has a contracted gallbladder based on the CT scan in 05/2017.  Patient does not have any other GI symptoms.  Patient is accompanied by her grandmother who is her healthcare power of attorney  Follow-up visit 06/14/2022 Since last visit, patient underwent cholecystectomy which was complicated by umbilical wound dehiscence with healing by secondary intention.   Patient denies any symptoms of right upper quadrant pain.  She denies having any diarrhea or constipation or rectal bleeding.  She reports doing well, maintaining her weight.  Patient is accompanied by her mom today who states that she has been eating healthy.  Patient could not tolerate Lialda for management of indeterminate colitis, resulted in nausea, dizziness and therefore stopped taking it several months ago  NSAIDs: None  Antiplts/Anticoagulants/Anti thrombotics: None  GI Procedures:  Colonoscopy 06/29/2021 - Aphtha in the terminal ileum. Biopsied. - Severe mucosal changes were found at the ileocecal valve secondary to Crohn's disease with ileitis. Segmental and patchy mild mucosal changes were found in the entire examined colon secondary to Crohn's disease with colonic involvement. Biopsied. - The distal rectum and anal verge are normal on retroflexion view. DIAGNOSIS:  A. TERMINAL ILEUM; COLD BIOPSY:  - FOCAL MILD ACTIVE ENTERITIS.  - NEGATIVE FOR ARCHITECTURAL FEATURES OF CHRONICITY, DYSPLASIA, AND  MALIGNANCY.  - SEE COMMENT.   B.  COLON, RIGHT; COLD BIOPSY:  - COLONIC MUCOSA WITH FOCAL REACTIVE LYMPHOID AGGREGATES.  - NEGATIVE FOR ACTIVE COLITIS, ARCHITECTURAL FEATURES OF CHRONICITY,  DYSPLASIA, AND MALIGNANCY.   C.  COLON, LEFT; COLD BIOPSY:  - PATCHY MILD ACTIVE COLITIS WITH REACTIVE LYMPHOID AGGREGATES INVOLVING  A MINORITY OF THE BIOPSY FRAGMENTS.  - NEGATIVE FOR ARCHITECTURAL FEATURES OF CHRONICITY, DYSPLASIA, AND  MALIGNANCY.  - SEE COMMENT.   D.  RECTUM; COLD BIOPSY:  - MILD ACTIVE PROCTITIS INVOLVING A MINORITY OF THE BIOPSY FRAGMENTS.  - NEGATIVE FOR ARCHITECTURAL FEATURES OF CHRONICITY, DYSPLASIA, AND  MALIGNANCY.  -  SEE COMMENT.   Comment:  The differential diagnosis for the above findings includes infection,  medication/drug-associated injury (NSAID vs other), diverticular  disease-associated colitis, vascular insufficiency, and early  inflammatory bowel  disease. Correlation with clinical impression and  appropriate microbiology tests is required.   No known family history of GI malignancy  Past Medical History:  Diagnosis Date   Chronic cholecystitis 08/2021   Colitis    Diabetes mellitus without complication ([place])    type 2   Hypertension    Mental deficiency    Obese    Right upper quadrant pain 05/31/2017    Past Surgical History:  Procedure Laterality Date   ABDOMINAL SURGERY Right    infancy (unknown stomach surgery)   COLONOSCOPY WITH PROPOFOL N/A 06/29/2021   Procedure: COLONOSCOPY WITH PROPOFOL;  Surgeon: Lin Landsman, MD;  Location: [facility] ENDOSCOPY;  Service: Gastroenterology;  Laterality: N/A;  [place] [phone])   Current Outpatient Medications:    acetaminophen (TYLENOL) 650 MG CR tablet, Take 650 mg by mouth every 8 (eight) hours as needed (Arthritis)., Disp: , Rfl:    allopurinol (ZYLOPRIM) 300 MG tablet, Take 300 mg by mouth in the morning., Disp: , Rfl:    atenolol (TENORMIN) 100 MG tablet, Take 100 mg by mouth at bedtime., Disp: , Rfl:    Cholecalciferol (VITAMIN D3) 2000 units TABS, Take 2,000 Units by mouth 2 (two) times daily as needed., Disp: , Rfl:    FARXIGA 10 MG TABS tablet, Take 10 mg by mouth in the morning., Disp: , Rfl:    lovastatin (MEVACOR) 40 MG tablet, Take 40 mg by mouth at bedtime., Disp: , Rfl:    Menthol (ICY HOT) 5 % PTCH, Apply 1 application. topically daily as needed (pain)., Disp: , Rfl:    metFORMIN (GLUCOPHAGE) 500 MG tablet, Take 500 mg by mouth 2 (two) times daily with a meal., Disp: , Rfl:    olmesartan-hydrochlorothiazide (BENICAR HCT) 40-12.5 MG tablet, Take 1 tablet by mouth at bedtime., Disp: , Rfl:    omeprazole-sodium bicarbonate (ZEGERID) 40-1100 MG capsule, Take 1 capsule by mouth daily as needed (Acid reflux)., Disp: , Rfl: ' Family History  Problem Relation Age of Onset   Diabetes Mother    Diabetes Sister       Social History   Tobacco Use   Smoking status: Never    Passive exposure: Yes   Smokeless tobacco: Never  Vaping Use   Vaping Use: Never used  Substance Use Topics   Alcohol use: No   Drug use: Never    Allergies as of 06/14/2022   (No Known Allergies)    Review of Systems:    All systems reviewed and negative except where noted in HPI.   Physical Exam:  BP 110/62 (BP Location: Left Arm, Patient Position: Sitting, Cuff Size: Normal)   Pulse 89   Temp 97.8 F (36.6 C) (Oral)   Ht '5\' 4"'$  (1.626 m)   Wt 223 lb 4 oz (101.3 kg)   BMI 38.32 kg/m  No LMP recorded.  General:   Alert,  Well-developed, well-nourished, pleasant and cooperative in NAD Head:  Normocephalic and atraumatic. Eyes:  Sclera clear, no icterus.   Conjunctiva pink. Ears:  Normal auditory acuity. Nose:  No deformity, discharge, or lesions. Mouth:  No deformity or lesions,oropharynx pink & moist. Neck:  Supple; no masses or thyromegaly. Lungs:  Respirations even and unlabored.  Clear throughout to auscultation.   No wheezes, crackles, or  rhonchi. No acute distress. Heart:  Regular rate and rhythm; no murmurs, clicks, rubs, or gallops. Abdomen:  Normal bowel sounds. Soft, non-tender and non-distended without masses, hepatosplenomegaly or hernias noted.  No guarding or rebound tenderness.   Rectal: Not performed Msk:  Symmetrical without gross deformities. Good, equal movement & strength bilaterally. Pulses:  Normal pulses noted. Extremities:  No clubbing or edema.  No cyanosis. Neurologic:  Alert and oriented x3;  grossly normal neurologically. Skin:  Intact without significant lesions or rashes. No jaundice. Psych:  Alert and cooperative. Normal mood and affect.  Imaging Studies: Reviewed  Assessment and Plan:   Susan Garrison is a 47 y.o. female with cognitive impairment, metabolic syndrome is seen in for follow-up of FOBT positive stool  Indeterminate colitis and enteritis based on the  colonoscopy and pathology results Elevated fecal calprotectin levels Patient is asymptomatic without having any diarrhea or anemia GI profile PCR negative for infection Repeat fecal calprotectin levels were elevated and patient did not tolerate Lialda, therefore discontinued it Recommend sigmoidoscopy with biopsies  Follow up in 6 months  Cephas Darby, MD

## 2022-06-15 ENCOUNTER — Telehealth: Payer: Self-pay | Admitting: Gastroenterology

## 2022-06-15 NOTE — Telephone Encounter (Signed)
Pt mother Burr Medico called in ref to something other than enema for her daughter would like a call back -[phone]

## 2022-06-15 NOTE — Telephone Encounter (Signed)
Patient caregiver states the patient is unable to do the enema unless she is in the hospital and they do it for her. She would like something else instead of the enema. Please advise

## 2022-06-15 NOTE — Telephone Encounter (Signed)
Patient mother verbalized understanding of instructions

## 2022-06-15 NOTE — Telephone Encounter (Signed)
Since patient is doing MiraLAX prep, okay to skip enema  RV

## 2022-06-17 ENCOUNTER — Telehealth: Payer: Self-pay

## 2022-06-17 NOTE — Telephone Encounter (Signed)
Patient mom called and left a message asking what time patient is needing [facility] Flexible sigmoid tomorrow. Informed patient mom that they will call her today between 1 and 3 today to tell her what time to be there

## 2022-06-18 ENCOUNTER — Ambulatory Visit: Payer: 59 | Admitting: Anesthesiology

## 2022-06-18 ENCOUNTER — Ambulatory Visit: Admit: 2022-06-18 | Payer: 59 | Admitting: Gastroenterology

## 2022-06-18 ENCOUNTER — Encounter: Payer: Self-pay | Admitting: Gastroenterology

## 2022-06-18 ENCOUNTER — Encounter
Admission: RE | Disposition: A | Discharge status: Home / Self Care | Payer: Self-pay | Source: Home / Self Care | Attending: Gastroenterology

## 2022-06-18 ENCOUNTER — Ambulatory Visit
Admission: RE | Admit: 2022-06-18 | Discharge: 2022-06-18 | Disposition: A | Discharge status: Home / Self Care | Payer: 59 | Attending: Gastroenterology | Admitting: Gastroenterology

## 2022-06-18 DIAGNOSIS — Z6838 Body mass index (BMI) 38.0-38.9, adult: Secondary | ICD-10-CM | POA: Diagnosis not present

## 2022-06-18 DIAGNOSIS — Z09 Encounter for follow-up examination after completed treatment for conditions other than malignant neoplasm: Secondary | ICD-10-CM | POA: Diagnosis present

## 2022-06-18 DIAGNOSIS — K523 Indeterminate colitis: Secondary | ICD-10-CM

## 2022-06-18 DIAGNOSIS — Z833 Family history of diabetes mellitus: Secondary | ICD-10-CM | POA: Diagnosis not present

## 2022-06-18 DIAGNOSIS — E119 Type 2 diabetes mellitus without complications: Secondary | ICD-10-CM | POA: Diagnosis not present

## 2022-06-18 DIAGNOSIS — K6389 Other specified diseases of intestine: Secondary | ICD-10-CM | POA: Diagnosis not present

## 2022-06-18 DIAGNOSIS — I10 Essential (primary) hypertension: Secondary | ICD-10-CM | POA: Diagnosis not present

## 2022-06-18 DIAGNOSIS — E669 Obesity, unspecified: Secondary | ICD-10-CM | POA: Diagnosis not present

## 2022-06-18 DIAGNOSIS — Z7984 Long term (current) use of oral hypoglycemic drugs: Secondary | ICD-10-CM | POA: Insufficient documentation

## 2022-06-18 HISTORY — PX: FLEXIBLE SIGMOIDOSCOPY: SHX5431

## 2022-06-18 HISTORY — DX: Indeterminate colitis: K52.3

## 2022-06-18 LAB — GLUCOSE, CAPILLARY: Glucose-Capillary: 139 mg/dL — ABNORMAL HIGH (ref 70–99)

## 2022-06-18 SURGERY — SIGMOIDOSCOPY, FLEXIBLE
Anesthesia: General

## 2022-06-18 MED ORDER — LIDOCAINE HCL (PF) 2 % IJ SOLN
INTRAMUSCULAR | Status: AC
  Filled 2022-06-18: qty 5

## 2022-06-18 MED ORDER — PROPOFOL 10 MG/ML IV BOLUS
INTRAVENOUS | Status: DC | PRN
  Administered 2022-06-18: 20 mg via INTRAVENOUS
  Administered 2022-06-18: 100 mg via INTRAVENOUS
  Administered 2022-06-18 (×3): 20 mg via INTRAVENOUS

## 2022-06-18 MED ORDER — SODIUM CHLORIDE 0.9 % IV SOLN: INTRAVENOUS | Status: DC

## 2022-06-18 MED ORDER — LIDOCAINE HCL (CARDIAC) PF 100 MG/5ML IV SOSY
PREFILLED_SYRINGE | INTRAVENOUS | Status: DC | PRN
  Administered 2022-06-18: 100 mg via INTRAVENOUS

## 2022-06-18 NOTE — Anesthesia Preprocedure Evaluation (Addendum)
Anesthesia Evaluation  Patient identified by MRN, date of birth, ID band Patient awake  General Assessment Comment:Alert, needs direction from caretaker  Reviewed: Allergy & Precautions, NPO status , Patient's Chart, lab work & pertinent test results, reviewed documented beta blocker date and time   Airway Mallampati: III  TM Distance: >3 FB Neck ROM: full    Dental  (+) Missing,    Pulmonary neg pulmonary ROS   Pulmonary exam normal        Cardiovascular Exercise Tolerance: Good hypertension, Pt. on medications and Pt. on home beta blockers Normal cardiovascular exam     Neuro/Psych        mild cognitive impairmentnegative neurological ROS     GI/Hepatic Neg liver ROS,GERD  Controlled and Medicated,,Indeterminate colitis   Endo/Other  diabetes, Well Controlled, Type 2, Oral Hypoglycemic Agents    Renal/GU negative Renal ROS  negative genitourinary   Musculoskeletal   Abdominal  (+) + obese  Peds  Hematology negative hematology ROS (+)   Anesthesia Other Findings Past Medical History: No date: Hypertension No date: Mental deficiency No date: Obese 05/31/2017: Right upper quadrant pain  Past Surgical History: No date: ABDOMINAL SURGERY; Right     Comment:  unknown to date of surgery     Reproductive/Obstetrics negative OB ROS                             Anesthesia Physical Anesthesia Plan  ASA: 2  Anesthesia Plan: General   Post-op Pain Management: Minimal or no pain anticipated   Induction: Intravenous  PONV Risk Score and Plan: TIVA and Treatment may vary due to age or medical condition  Airway Management Planned: Natural Airway  Additional Equipment:   Intra-op Plan:   Post-operative Plan:   Informed Consent: I have reviewed the patients History and Physical, chart, labs and discussed the procedure including the risks, benefits and alternatives for the proposed  anesthesia with the patient or authorized representative who has indicated his/her understanding and acceptance.     Dental advisory given and Consent reviewed with POA  Plan Discussed with: Anesthesiologist, CRNA and Surgeon  Anesthesia Plan Comments:         Anesthesia Quick Evaluation

## 2022-06-18 NOTE — Anesthesia Postprocedure Evaluation (Signed)
Anesthesia Post Note  Patient: Susan Garrison  Procedure(s) Performed: [place]  Patient location during evaluation: PACU Anesthesia Type: General Level of consciousness: awake and alert Pain management: pain level controlled Vital Signs Assessment: post-procedure vital signs reviewed and stable Respiratory status: spontaneous breathing, nonlabored ventilation and respiratory function stable Cardiovascular status: blood pressure returned to baseline and stable Postop Assessment: no apparent nausea or vomiting Anesthetic complications: no   No notable events documented.   Last Vitals:  Vitals:   06/18/22 1247 06/18/22 1257  BP: 119/72 100/88  Pulse: 77 77  Resp: 18 (!) 22  Temp:    SpO2: 97% 100%    Last Pain:  Vitals:   06/18/22 1257  TempSrc:   PainSc: 0-No pain                 [place] Ouch

## 2022-06-18 NOTE — Op Note (Signed)
[facility] Gastroenterology Patient Name: Susan Garrison Procedure Date: 06/18/2022 12:16 PM MRN: [id number] Account #: [id number] Date of Birth: 1975-06-21 Admit Type: Outpatient Age: 47 Room: [facility] ENDO ROOM 2 Gender: Female Note Status: Finalized Instrument Name: Upper Endoscope [id number] Procedure:             Flexible Sigmoidoscopy Indications:           Follow-up of indeterminate colitis Providers:             Lin Landsman MD, MD Referring MD:          Leonard Downing (Referring MD) Medicines:             General Anesthesia Complications:         No immediate complications. Estimated blood loss: None. Procedure:             Pre-Anesthesia Assessment:                        - Prior to the procedure, a History and Physical was                         performed, and patient medications and allergies were                         reviewed. The patient is competent. The risks and                         benefits of the procedure and the sedation options and                         risks were discussed with the patient. All questions                         were answered and informed consent was obtained.                         Patient identification and proposed procedure were                         verified by the physician, the nurse, the                         anesthesiologist, the anesthetist and the technician                         in the pre-procedure area in the procedure room in the                         endoscopy suite. Mental Status Examination: alert and                         oriented. Airway Examination: normal oropharyngeal                         airway and neck mobility. Respiratory Examination:                         clear to auscultation. CV Examination: normal.  Prophylactic Antibiotics: The patient does not require                         prophylactic antibiotics. Prior Anticoagulants: The                          patient has taken no anticoagulant or antiplatelet                         agents. ASA Grade Assessment: II - A patient with mild                         systemic disease. After reviewing the risks and                         benefits, the patient was deemed in satisfactory                         condition to undergo the procedure. The anesthesia                         plan was to use general anesthesia. Immediately prior                         to administration of medications, the patient was                         re-assessed for adequacy to receive sedatives. The                         heart rate, respiratory rate, oxygen saturations,                         blood pressure, adequacy of pulmonary ventilation, and                         response to care were monitored throughout the                         procedure. The physical status of the patient was                         re-assessed after the procedure.                        After obtaining informed consent, the scope was passed                         under direct vision. The Endoscope was introduced                         through the anus and advanced to the the descending                         colon. The flexible sigmoidoscopy was accomplished                         without difficulty. The patient tolerated the  procedure well. The quality of the bowel preparation                         was adequate. Findings:      The perianal and digital rectal examinations were normal. Pertinent       negatives include normal sphincter tone and no palpable rectal lesions.      A patchy area of mildly erythematous mucosa was found in the sigmoid       colon and in the descending colon. Biopsies were taken with a cold       forceps for histology.      The exam was otherwise without abnormality. Impression:            - Erythematous mucosa in the sigmoid colon and in the                          descending colon. Biopsied.                        - The examination was otherwise normal. Recommendation:        - Discharge patient to home (with escort).                        - Resume previous diet today.                        - Await pathology results. Procedure Code(s):     --- Professional ---                        [phone], Sigmoidoscopy, flexible; with biopsy, single or                         multiple Diagnosis Code(s):     --- Professional ---                        K63.89, Other specified diseases of intestine                        K51.90, Ulcerative colitis, unspecified, without                         complications CPT copyright 2022 American [employer]. All rights reserved. The codes documented in this report are preliminary and upon coder review may  be revised to meet current compliance requirements. Dr. Ulyess Mort Lin Landsman MD, MD 06/18/2022 12:33:16 PM This report has been signed electronically. Number of Addenda: 0 Note Initiated On: 06/18/2022 12:16 PM Total Procedure Duration: 0 hours 7 minutes 48 seconds  Estimated Blood Loss:  Estimated blood loss: none.      [facility]

## 2022-06-18 NOTE — H&P (Signed)
Cephas Darby, MD [address]  [place], [place] [address]  Main: [phone]  Fax: [phone] Pager: [phone]  Primary Care Physician:  Leonard Downing, MD Primary Gastroenterologist:  Dr. Cephas Darby  Pre-Procedure History & Physical: HPI:  Susan Garrison is a 47 y.o. female is here for an flexible sigmoidoscopy.   Past Medical History:  Diagnosis Date   Chronic cholecystitis 08/2021   Colitis    Diabetes mellitus without complication ([facility])    type 2   Hypertension    Mental deficiency    Obese    Right upper quadrant pain 05/31/2017    Past Surgical History:  Procedure Laterality Date   ABDOMINAL SURGERY Right    infancy (unknown stomach surgery)   CHOLECYSTECTOMY  09/03/2021   COLONOSCOPY WITH PROPOFOL N/A 06/29/2021   Procedure: COLONOSCOPY WITH PROPOFOL;  Surgeon: Lin Landsman, MD;  Location: [facility];  Service: Gastroenterology;  Laterality: N/A;  [place] [phone])    Prior to Admission medications   Medication Sig Start Date End Date Taking? Authorizing Provider  acetaminophen (TYLENOL) 650 MG CR tablet Take 650 mg by mouth every 8 (eight) hours as needed (Arthritis).    [provider]  allopurinol (ZYLOPRIM) 300 MG tablet Take 300 mg by mouth in the morning. 05/21/21   [provider]  atenolol (TENORMIN) 100 MG tablet Take 100 mg by mouth at bedtime.    [provider]  Cholecalciferol (VITAMIN D3) 2000 units TABS Take 2,000 Units by mouth 2 (two) times daily as needed.    [provider]  FARXIGA 10 MG TABS tablet Take 10 mg by mouth in the morning. 06/08/21   [provider]  lovastatin (MEVACOR) 40 MG tablet Take 40 mg by mouth at bedtime. 05/21/21   [provider]  Menthol (ICY HOT) 5 % PTCH Apply 1 application. topically daily as needed (pain).    [provider]  metFORMIN (GLUCOPHAGE) 500  MG tablet Take 500 mg by mouth 2 (two) times daily with a meal.    [provider]  olmesartan-hydrochlorothiazide (BENICAR HCT) 40-12.5 MG tablet Take 1 tablet by mouth at bedtime. 05/21/21   [provider]  omeprazole-sodium bicarbonate (ZEGERID) 40-1100 MG capsule Take 1 capsule by mouth daily as needed (Acid reflux).    [provider]    Allergies as of 06/15/2022   (No Known Allergies)    Family History  Problem Relation Age of Onset   Diabetes Mother    Diabetes Sister     Social History   Socioeconomic History   Marital status: Single    Spouse name: Not on file   Number of children: Not on file   Years of education: Not on file   Highest education level: Not on file  Occupational History   Not on file  Tobacco Use   Smoking status: Never    Passive exposure: Yes   Smokeless tobacco: Never  Vaping Use   Vaping Use: Never used  Substance and Sexual Activity   Alcohol use: No   Drug use: Never   Sexual activity: Not Currently  Other Topics Concern   Not on file  Social History Narrative   Lives with family caretaker   Social Determinants of Health   Financial Resource Strain: Not on file  Food Insecurity: Not on file  Transportation Needs: Not on file  Physical Activity: Not on file  Stress: Not on  file  Social Connections: Not on file  Intimate Partner Violence: Not on file    Review of Systems: See HPI, otherwise negative ROS  Physical Exam: BP (!) 106/48   Pulse 76   Temp 97.8 F (36.6 C) (Temporal)   Resp 18   SpO2 98%  General:   Alert,  pleasant and cooperative in NAD Head:  Normocephalic and atraumatic. Neck:  Supple; no masses or thyromegaly. Lungs:  Clear throughout to auscultation.    Heart:  Regular rate and rhythm. Abdomen:  Soft, nontender and nondistended. Normal bowel sounds, without guarding, and without rebound.   Neurologic:  Alert and  oriented x4;  grossly normal  neurologically.  Impression/Plan: Susan Garrison is here for an flexible sigmoidoscopy to be performed for Indeterminate left-sided colitis   Risks, benefits, limitations, and alternatives regarding  flexible sigmoidoscopy have been reviewed with the patient.  Questions have been answered.  All parties agreeable.   Sherri Sear, MD  06/18/2022, 12:18 PM

## 2022-06-18 NOTE — Transfer of Care (Signed)
Immediate Anesthesia Transfer of Care Note  Patient: Susan Garrison  Procedure(s) Performed: FLEXIBLE SIGMOIDOSCOPY  Patient Location: PACU  Anesthesia Type:General  Level of Consciousness: awake  Airway & Oxygen Therapy: Patient Spontanous Breathing  Post-op Assessment: Report given to RN and Post -op Vital signs reviewed and stable  Post vital signs: Reviewed and stable  Last Vitals:  Vitals Value Taken Time  BP 106/73 1238  Temp 35.9 1238  Pulse 81 06/18/22 1239  Resp 19 06/18/22 1239  SpO2 97 % 06/18/22 1239  Vitals shown include unvalidated device data.  Last Pain:  Vitals:   06/18/22 1037  TempSrc: Temporal  PainSc: 0-No pain         Complications: No notable events documented.

## 2022-06-21 ENCOUNTER — Encounter: Payer: Self-pay | Admitting: Gastroenterology

## 2022-06-21 LAB — SURGICAL PATHOLOGY

## 2022-06-22 ENCOUNTER — Telehealth: Payer: Self-pay

## 2022-06-22 NOTE — Telephone Encounter (Signed)
-----   Message from Lin Landsman, MD sent at 06/22/2022  4:06 PM EST ----- Please inform patient's mom that the pathology results from colon biopsies revealed that she does not have any colitis.  She does not need any treatment at this time  [facility]

## 2022-06-22 NOTE — Telephone Encounter (Signed)
Patient mother verbalized understanding of results  °

## 2022-08-19 IMAGING — CT CT ABD-PELV W/ CM
2 of 5 series · 17 of 46 positions shown, 19 images · IV contrast (agent unspecified)
Comparison: 06/08/2017

CLINICAL DATA: Abdominal pain

EXAM:
CT ABDOMEN AND PELVIS WITH CONTRAST
TECHNIQUE: Multidetector CT imaging of the abdomen and pelvis was performed
using the standard protocol following bolus administration of
intravenous contrast.

[Series 2: abd pelvis 5.00 · axial · 0.98mm/px · z∈[-1512,-1072]mm · 14 of 100 slices shown, 16 images]
[im 6/100  soft-tissue]
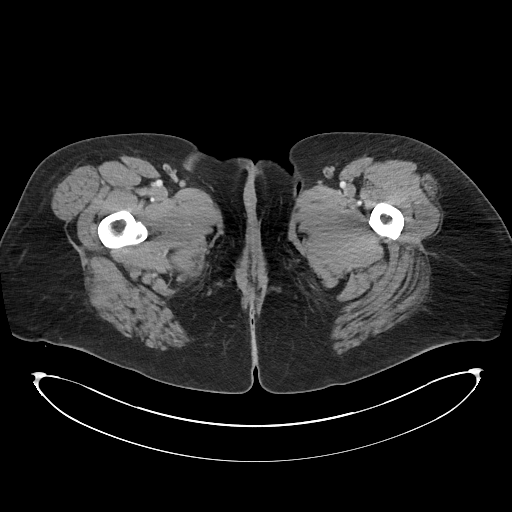
[im 6/100  bone]
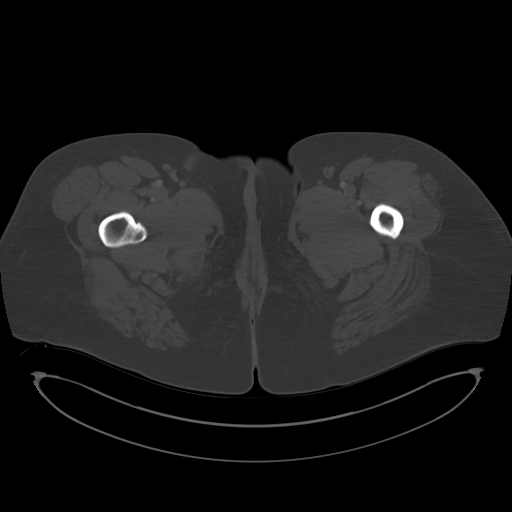
[im 12/100  soft-tissue]
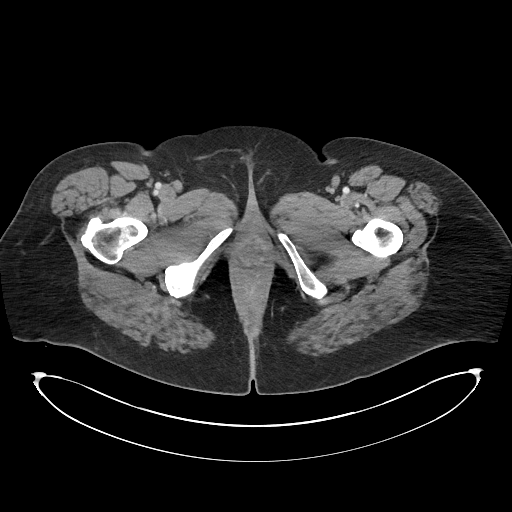
[im 23/100  soft-tissue]
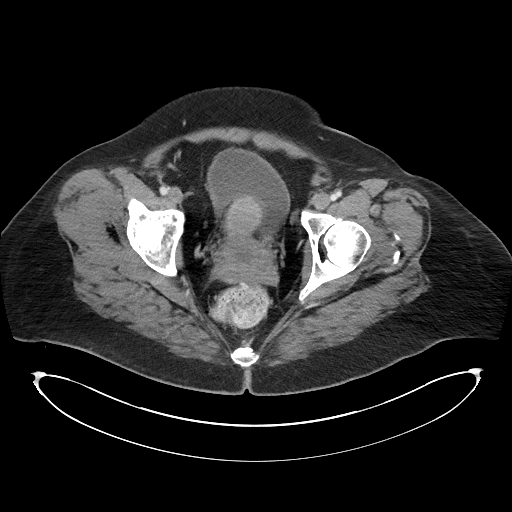
[im 28/100  soft-tissue]
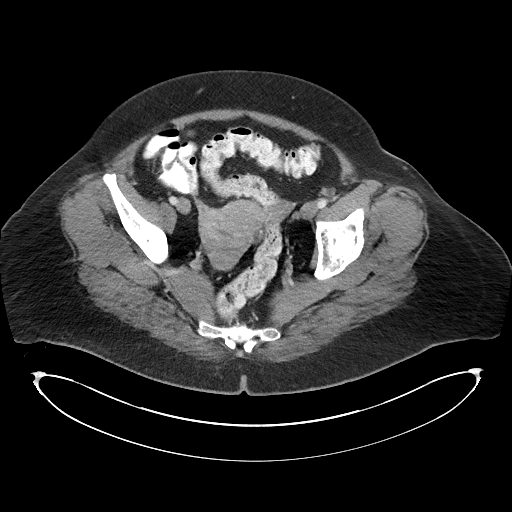
[im 34/100  soft-tissue]
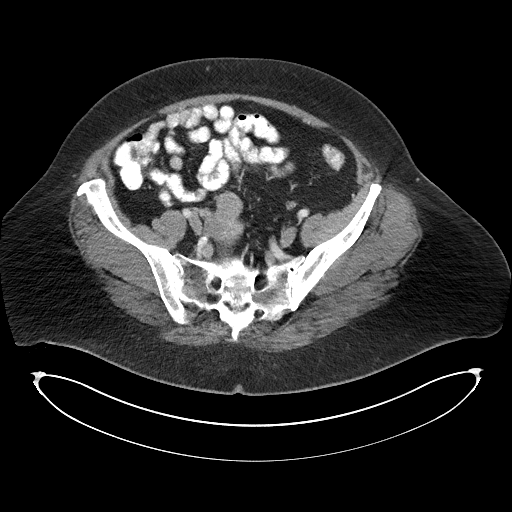
[im 39/100  soft-tissue]
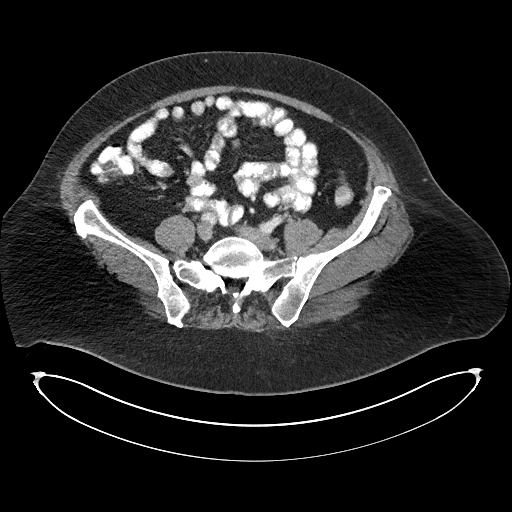
[im 45/100  soft-tissue]
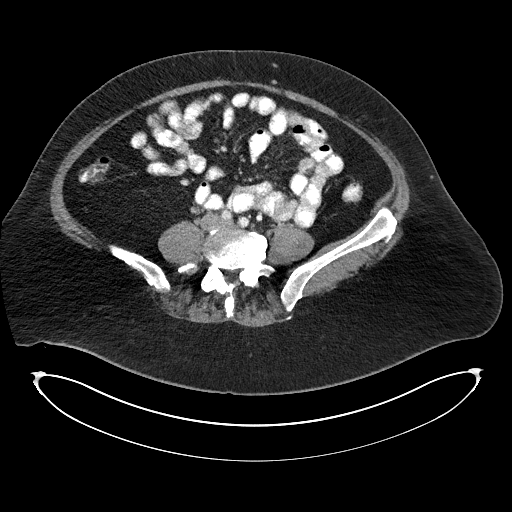
[im 56/100  soft-tissue]
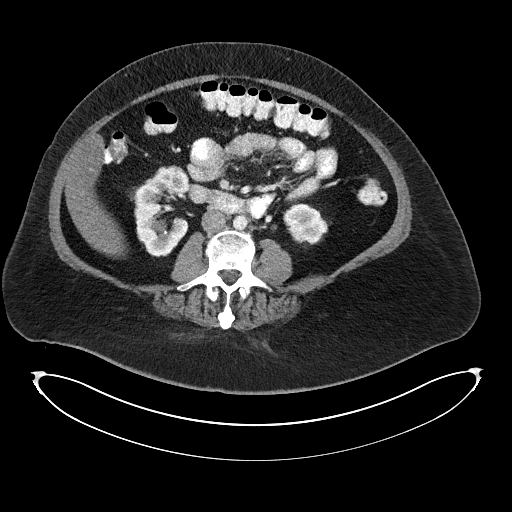
[im 61/100  soft-tissue]
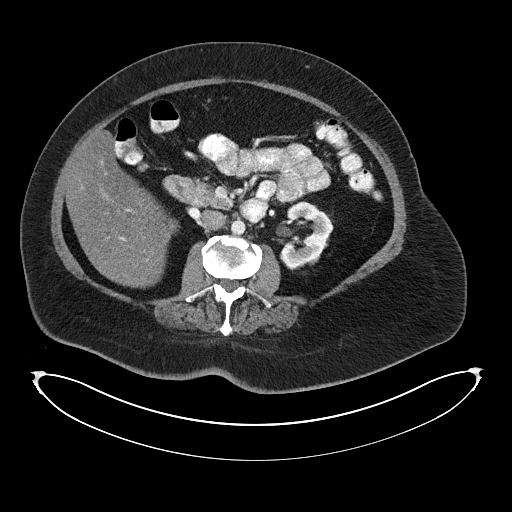
[im 61/100  bone]
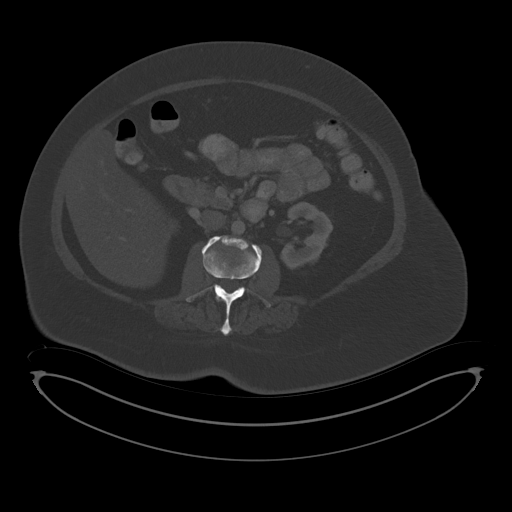
[im 67/100  soft-tissue]
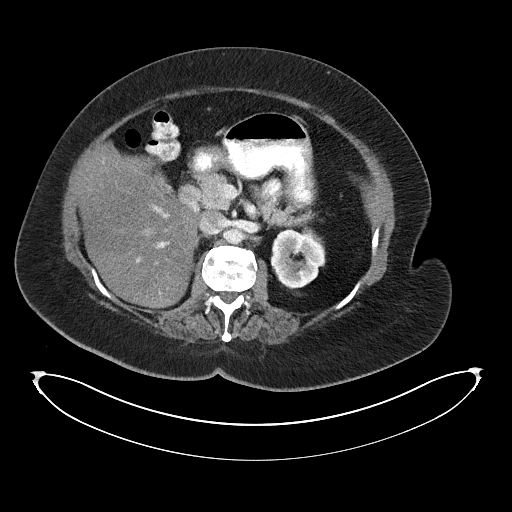
[im 72/100  soft-tissue]
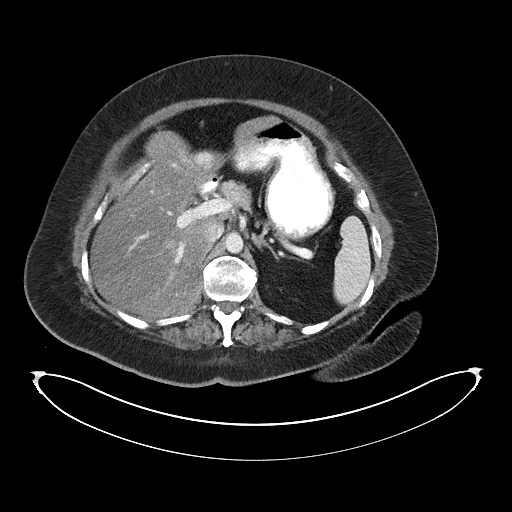
[im 78/100  soft-tissue]
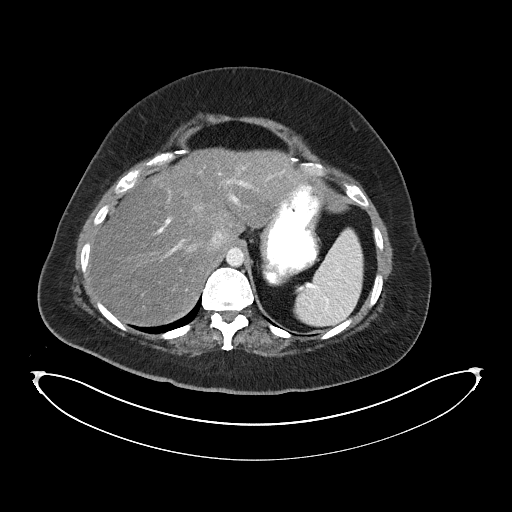
[im 89/100  soft-tissue]
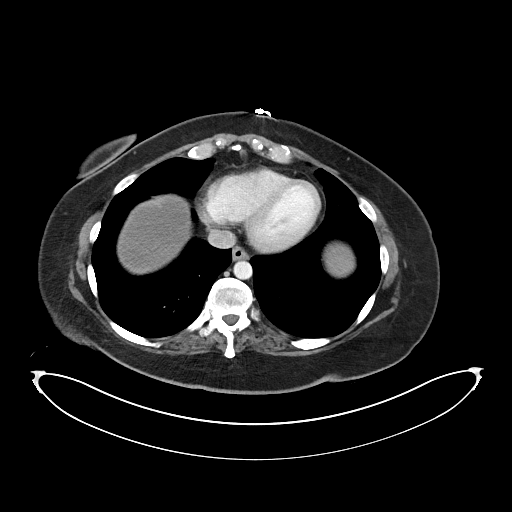
[im 94/100  soft-tissue]
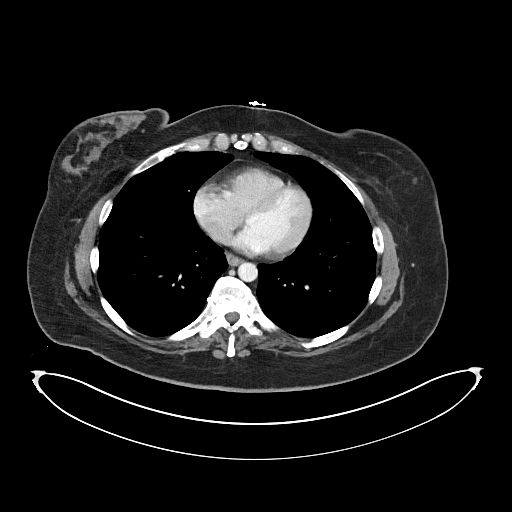

[Series 4: coronals abd pelvis 2.00 cor · coronal · 0.97mm/px · 3 of 175 slices shown]
[im 59/175  soft-tissue]
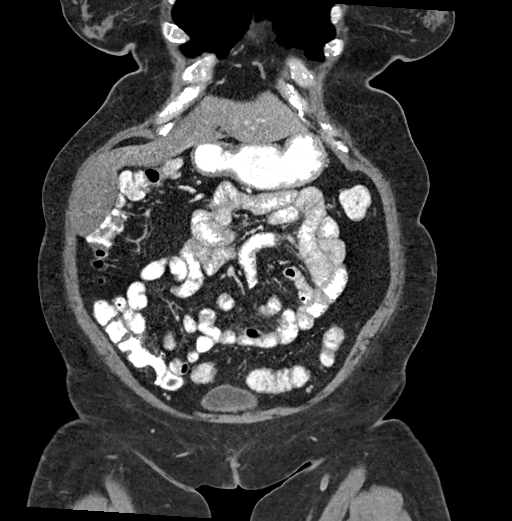
[im 78/175  soft-tissue]
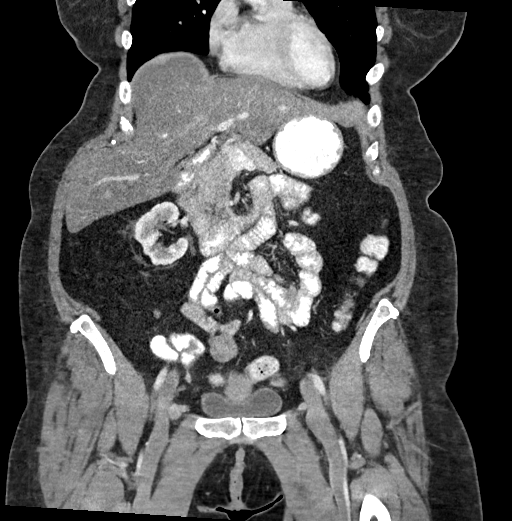
[im 97/175  soft-tissue]
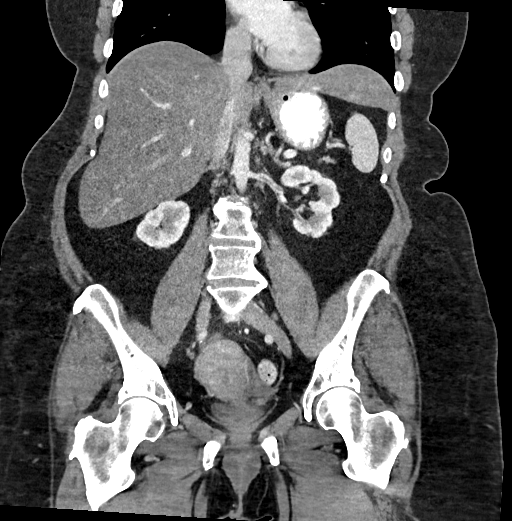

[17 of 46 positions shown; findings below may reference images not displayed]

RADIATION DOSE REDUCTION: This exam was performed according to the
departmental dose-optimization program which includes automated
exposure control, adjustment of the mA and/or kV according to
patient size and/or use of iterative reconstruction technique.

CONTRAST:  100mL OMNIPAQUE IOHEXOL 300 MG/ML  SOLN
FINDINGS: Lower chest: No acute abnormality

Hepatobiliary: Prior cholecystectomy. Diffuse fatty infiltration of
the liver. No focal hepatic abnormality.

Pancreas: No focal abnormality or ductal dilatation.

Spleen: No focal abnormality.  Normal size.

Adrenals/Urinary Tract: Punctate 1-2 mm stone in the midpole of the
right kidney. No ureteral stones or hydronephrosis. Urinary bladder
and adrenal glands unremarkable.

Stomach/Bowel: Normal appendix. Stomach, large and small bowel
grossly unremarkable.

Vascular/Lymphatic: No evidence of aneurysm or adenopathy.

Reproductive: Fibroids noted within the uterus.  No adnexal mass.

Other: No free fluid or free air.

Musculoskeletal: No acute bony abnormality.
IMPRESSION: Hepatic steatosis.

Punctate right midpole nephrolithiasis.  No hydronephrosis.

Fibroid uterus.

No acute findings in the abdomen or pelvis.

## 2023-05-12 ENCOUNTER — Ambulatory Visit: Payer: 59 | Admitting: Gastroenterology

## 2023-05-12 ENCOUNTER — Encounter: Payer: Self-pay | Admitting: Gastroenterology

## 2023-05-12 VITALS — BP 96/68 | HR 79 | Temp 97.5°F | Ht 64.0 in | Wt 224.2 lb

## 2023-05-12 DIAGNOSIS — Z8719 Personal history of other diseases of the digestive system: Secondary | ICD-10-CM | POA: Diagnosis not present

## 2023-05-12 DIAGNOSIS — Z09 Encounter for follow-up examination after completed treatment for conditions other than malignant neoplasm: Secondary | ICD-10-CM

## 2023-05-12 NOTE — Progress Notes (Signed)
lft   Arlyss Repress, MD [address]  [address]  [place], [place] [address]  Main: [phone]  Fax: [phone]    Gastroenterology Consultation  Referring Provider:     Kaleen Mask, * Primary Care Physician:  Kaleen Mask, MD Primary Gastroenterologist:  Dr. Arlyss Repress Reason for Consultation: Indeterminate left-sided colitis        HPI:   Susan Garrison is a 48 y.o. female referred by Dr. Jeannetta Nap, Curly Rim, MD  for consultation & management of fecal occult positive test.  Patient has history of mild cognitive impairment, accompanied by her grandmother who is her caretaker.  Patient denies any GI symptoms.  No known history of anemia.  She denies any constipation, diarrhea.  She denies any rectal bleeding.  She had a surgery in her abdomen after her birth.  Follow-up visit 08/03/2021 Patient underwent colonoscopy for FOBT positive.  Found to have inflammation in the colon as well as aphthous ulcers in the terminal ileum concerning for Crohn's disease.  However, the biopsies revealed active ileitis and mild patchy active colitis and proctitis only.  There was no evidence of granulomas or background chronicity.  Her GI profile PCR was negative for infection.  Fecal calprotectin levels were elevated at 182.  Patient reports having right upper quadrant discomfort radiating to right lower back, worse after eating.  She does acknowledge eating fatty foods, red meat on a regular basis as well as carbonated beverages on a daily basis.  She has been gaining weight.  No evidence of cholelithiasis but she has a contracted gallbladder based on the CT scan in 05/2017.  Patient does not have any other GI symptoms.  Patient is accompanied by her grandmother who is her healthcare power of attorney  Follow-up visit 06/14/2022 Since last visit, patient underwent cholecystectomy which was complicated by umbilical wound dehiscence with healing by secondary intention.   Patient denies any symptoms of right upper quadrant pain.  She denies having any diarrhea or constipation or rectal bleeding.  She reports doing well, maintaining her weight.  Patient is accompanied by her mom today who states that she has been eating healthy.  Patient could not tolerate Lialda for management of indeterminate colitis, resulted in nausea, dizziness and therefore stopped taking it several months ago  Follow-up visit 05/12/2023 Susan Garrison is here for follow-up of indeterminate colitis.  She underwent flexible sigmoidoscopy which revealed mild erythema in the sigmoid colon, pathology was unremarkable.  Today, patient denies any GI symptoms including abdominal pain, bloating, diarrhea, rectal bleeding.  NSAIDs: None  Antiplts/Anticoagulants/Anti thrombotics: None  GI Procedures:  Flexible sigmoidoscopy 06/18/2022 - Erythematous mucosa in the sigmoid colon and in the descending colon. Biopsied. - The examination was otherwise normal. DIAGNOSIS:  A. COLON, RANDOM LEFT; COLD BIOPSY:  - BENIGN COLONIC MUCOSA WITH NO SIGNIFICANT HISTOPATHOLOGIC CHANGE.  - NEGATIVE FOR ACTIVE MUCOSAL COLITIS, ARCHITECTURAL CHANGES OF CHRONIC  COLITIS, AND FEATURES OF MICROSCOPIC COLITIS.  - NEGATIVE FOR DYSPLASIA AND MALIGNANCY.   Colonoscopy 06/29/2021 - Aphtha in the terminal ileum. Biopsied. - Severe mucosal changes were found at the ileocecal valve secondary to Crohn's disease with ileitis. Segmental and patchy mild mucosal changes were found in the entire examined colon secondary to Crohn's disease with colonic involvement. Biopsied. - The distal rectum and anal verge are normal on retroflexion view. DIAGNOSIS:  A. TERMINAL ILEUM; COLD BIOPSY:  - FOCAL MILD ACTIVE ENTERITIS.  - NEGATIVE FOR ARCHITECTURAL FEATURES OF CHRONICITY, DYSPLASIA, AND  MALIGNANCY.  -  SEE COMMENT.   B.  COLON, RIGHT; COLD BIOPSY:  - COLONIC MUCOSA WITH FOCAL REACTIVE LYMPHOID AGGREGATES.  - NEGATIVE FOR ACTIVE  COLITIS, ARCHITECTURAL FEATURES OF CHRONICITY,  DYSPLASIA, AND MALIGNANCY.   C.  COLON, LEFT; COLD BIOPSY:  - PATCHY MILD ACTIVE COLITIS WITH REACTIVE LYMPHOID AGGREGATES INVOLVING  A MINORITY OF THE BIOPSY FRAGMENTS.  - NEGATIVE FOR ARCHITECTURAL FEATURES OF CHRONICITY, DYSPLASIA, AND  MALIGNANCY.  - SEE COMMENT.   D.  RECTUM; COLD BIOPSY:  - MILD ACTIVE PROCTITIS INVOLVING A MINORITY OF THE BIOPSY FRAGMENTS.  - NEGATIVE FOR ARCHITECTURAL FEATURES OF CHRONICITY, DYSPLASIA, AND  MALIGNANCY.  - SEE COMMENT.   Comment:  The differential diagnosis for the above findings includes infection,  medication/drug-associated injury (NSAID vs other), diverticular  disease-associated colitis, vascular insufficiency, and early  inflammatory bowel disease. Correlation with clinical impression and  appropriate microbiology tests is required.   No known family history of GI malignancy  Past Medical History:  Diagnosis Date   Chronic cholecystitis 08/2021   Colitis    Diabetes mellitus without complication ([facility])    type 2   Hypertension    Mental deficiency    Obese    Right upper quadrant pain 05/31/2017    Past Surgical History:  Procedure Laterality Date   ABDOMINAL SURGERY Right    infancy (unknown stomach surgery)   CHOLECYSTECTOMY  09/03/2021   COLONOSCOPY WITH PROPOFOL N/A 06/29/2021   Procedure: COLONOSCOPY WITH PROPOFOL;  Surgeon: Toney Reil, MD;  Location: [facility] ENDOSCOPY;  Service: Gastroenterology;  Laterality: N/A;  [facility] [employer]ARD[employer] PROGRAM MANAGER - ROBERT LEE [phone])   FLEXIBLE SIGMOIDOSCOPY N/A 06/18/2022   Procedure: FLEXIBLE SIGMOIDOSCOPY;  Surgeon: Toney Reil, MD;  Location: [facility] ENDOSCOPY;  Service: Gastroenterology;  Laterality: N/A;   Current Outpatient Medications:    acetaminophen (TYLENOL) 650 MG CR tablet, Take 650 mg by mouth every 8 (eight) hours as needed (Arthritis)., Disp: , Rfl:    allopurinol (ZYLOPRIM) 300 MG tablet,  Take 300 mg by mouth in the morning., Disp: , Rfl:    atenolol (TENORMIN) 100 MG tablet, Take 100 mg by mouth at bedtime., Disp: , Rfl:    Cholecalciferol (VITAMIN D3) 2000 units TABS, Take 2,000 Units by mouth 2 (two) times daily as needed., Disp: , Rfl:    FARXIGA 10 MG TABS tablet, Take 10 mg by mouth in the morning., Disp: , Rfl:    lovastatin (MEVACOR) 40 MG tablet, Take 40 mg by mouth at bedtime., Disp: , Rfl:    Menthol (ICY HOT) 5 % PTCH, Apply 1 application. topically daily as needed (pain)., Disp: , Rfl:    metFORMIN (GLUCOPHAGE) 500 MG tablet, Take 500 mg by mouth 2 (two) times daily with a meal., Disp: , Rfl:    olmesartan-hydrochlorothiazide (BENICAR HCT) 40-12.5 MG tablet, Take 1 tablet by mouth at bedtime., Disp: , Rfl:    omeprazole-sodium bicarbonate (ZEGERID) 40-1100 MG capsule, Take 1 capsule by mouth daily as needed (Acid reflux)., Disp: , Rfl:    RYBELSUS 3 MG TABS, Take 1 tablet by mouth daily., Disp: , Rfl: ' Family History  Problem Relation Age of Onset   Diabetes Mother    Diabetes Sister      Social History   Tobacco Use   Smoking status: Never    Passive exposure: Yes   Smokeless tobacco: Never  Vaping Use   Vaping status: Never Used  Substance Use Topics   Alcohol use: No   Drug use: Never  Allergies as of 05/12/2023   (No Known Allergies)    Review of Systems:    All systems reviewed and negative except where noted in HPI.   Physical Exam:  BP 96/68 (BP Location: Left Arm, Patient Position: Sitting, Cuff Size: Large)   Pulse 79   Temp (!) 97.5 F (36.4 C) (Oral)   Ht 5\' 4"  (1.626 m)   Wt 224 lb 4 oz (101.7 kg)   BMI 38.49 kg/m  No LMP recorded.  General:   Alert,  Well-developed, well-nourished, pleasant and cooperative in NAD Head:  Normocephalic and atraumatic. Eyes:  Sclera clear, no icterus.   Conjunctiva pink. Ears:  Normal auditory acuity. Nose:  No deformity, discharge, or lesions. Mouth:  No deformity or lesions,oropharynx  pink & moist. Neck:  Supple; no masses or thyromegaly. Lungs:  Respirations even and unlabored.  Clear throughout to auscultation.   No wheezes, crackles, or rhonchi. No acute distress. Heart:  Regular rate and rhythm; no murmurs, clicks, rubs, or gallops. Abdomen:  Normal bowel sounds. Soft, non-tender and non-distended without masses, hepatosplenomegaly or hernias noted.  No guarding or rebound tenderness.   Rectal: Not performed Msk:  Symmetrical without gross deformities. Good, equal movement & strength bilaterally. Pulses:  Normal pulses noted. Extremities:  No clubbing or edema.  No cyanosis. Neurologic:  Alert and oriented x3;  grossly normal neurologically. Skin:  Intact without significant lesions or rashes. No jaundice. Psych:  Alert and cooperative. Normal mood and affect.  Imaging Studies: Reviewed  Assessment and Plan:   Susan Garrison is a 48 y.o. female with cognitive impairment, metabolic syndrome is seen in for follow-up of indeterminate colitis.  She had mildly elevated fecal calprotectin levels.  Stool studies were negative for infection.  Clinically asymptomatic.  Repeat sigmoidoscopy with biopsies did not reveal any evidence of colitis Do not recommend any treatment at this time   Follow up as needed  Arlyss Repress, MD

## 2024-01-19 ENCOUNTER — Other Ambulatory Visit (HOSPITAL_COMMUNITY): Payer: Self-pay | Admitting: Nephrology

## 2024-01-19 DIAGNOSIS — N1831 Chronic kidney disease, stage 3a: Secondary | ICD-10-CM

## 2024-03-20 ENCOUNTER — Ambulatory Visit (HOSPITAL_COMMUNITY)

## 2024-03-20 ENCOUNTER — Encounter (HOSPITAL_COMMUNITY): Payer: Self-pay

## 2024-04-26 ENCOUNTER — Encounter (HOSPITAL_COMMUNITY): Payer: Self-pay

## 2024-04-26 ENCOUNTER — Ambulatory Visit (HOSPITAL_COMMUNITY)
